# Patient Record
Sex: Female | Born: 1960 | Race: Black or African American | Hispanic: No | Marital: Single | Smoking: Never smoker
Health system: Southern US, Community
[De-identification: ages and names within clinical notes are randomized; demographics above are authoritative.]

## PROBLEM LIST (undated history)

## (undated) DIAGNOSIS — I4891 Unspecified atrial fibrillation: Secondary | ICD-10-CM

## (undated) DIAGNOSIS — D869 Sarcoidosis, unspecified: Secondary | ICD-10-CM

## (undated) DIAGNOSIS — I509 Heart failure, unspecified: Secondary | ICD-10-CM

## (undated) DIAGNOSIS — M1990 Unspecified osteoarthritis, unspecified site: Secondary | ICD-10-CM

## (undated) DIAGNOSIS — R12 Heartburn: Secondary | ICD-10-CM

## (undated) DIAGNOSIS — N200 Calculus of kidney: Secondary | ICD-10-CM

## (undated) DIAGNOSIS — E119 Type 2 diabetes mellitus without complications: Secondary | ICD-10-CM

## (undated) HISTORY — PX: ABDOMINAL HYSTERECTOMY: SHX81

## (undated) HISTORY — DX: Unspecified osteoarthritis, unspecified site: M19.90

## (undated) HISTORY — PX: KNEE SURGERY: SHX244

## (undated) HISTORY — DX: Heartburn: R12

## (undated) HISTORY — DX: Calculus of kidney: N20.0

## (undated) HISTORY — PX: PACEMAKER INSERTION: SHX728

---

## 2004-04-11 ENCOUNTER — Ambulatory Visit: Payer: Self-pay | Admitting: Specialist

## 2004-06-15 ENCOUNTER — Ambulatory Visit: Payer: Self-pay | Admitting: Rheumatology

## 2005-10-08 ENCOUNTER — Emergency Department: Payer: Self-pay | Admitting: Emergency Medicine

## 2005-11-01 ENCOUNTER — Ambulatory Visit: Payer: Self-pay | Admitting: Family Medicine

## 2007-06-11 HISTORY — PX: HAND SURGERY: SHX662

## 2007-10-21 ENCOUNTER — Ambulatory Visit: Payer: Self-pay | Admitting: Ophthalmology

## 2008-06-14 ENCOUNTER — Encounter: Payer: Self-pay | Admitting: Orthopedic Surgery

## 2008-10-04 ENCOUNTER — Ambulatory Visit: Payer: Self-pay | Admitting: Orthopedic Surgery

## 2009-11-17 ENCOUNTER — Ambulatory Visit: Payer: Self-pay | Admitting: Orthopedic Surgery

## 2010-03-13 ENCOUNTER — Ambulatory Visit: Payer: Self-pay | Admitting: Orthopedic Surgery

## 2010-06-10 HISTORY — PX: SHOULDER SURGERY: SHX246

## 2011-01-21 ENCOUNTER — Ambulatory Visit: Payer: Self-pay

## 2011-02-15 ENCOUNTER — Ambulatory Visit: Payer: Self-pay | Admitting: Internal Medicine

## 2011-10-14 ENCOUNTER — Encounter: Payer: Self-pay | Admitting: Orthopedic Surgery

## 2011-11-09 ENCOUNTER — Encounter: Payer: Self-pay | Admitting: Orthopedic Surgery

## 2012-01-31 ENCOUNTER — Ambulatory Visit: Payer: Self-pay | Admitting: Family Medicine

## 2012-04-21 ENCOUNTER — Ambulatory Visit: Payer: Self-pay | Admitting: Physician Assistant

## 2012-05-06 ENCOUNTER — Ambulatory Visit: Payer: Self-pay | Admitting: Family Medicine

## 2012-10-19 ENCOUNTER — Ambulatory Visit: Payer: Self-pay

## 2014-11-17 ENCOUNTER — Other Ambulatory Visit: Payer: Self-pay | Admitting: Family Medicine

## 2014-11-17 ENCOUNTER — Ambulatory Visit
Admission: RE | Admit: 2014-11-17 | Discharge: 2014-11-17 | Disposition: A | Discharge status: Home / Self Care | Payer: Federal, State, Local not specified - PPO | Source: Ambulatory Visit | Attending: Family Medicine | Admitting: Family Medicine

## 2014-11-17 DIAGNOSIS — R109 Unspecified abdominal pain: Secondary | ICD-10-CM | POA: Diagnosis present

## 2014-11-17 DIAGNOSIS — R319 Hematuria, unspecified: Secondary | ICD-10-CM | POA: Diagnosis present

## 2016-09-13 ENCOUNTER — Ambulatory Visit
Admission: EM | Admit: 2016-09-13 | Discharge: 2016-09-13 | Disposition: A | Discharge status: Home / Self Care | Payer: Federal, State, Local not specified - PPO | Attending: Family Medicine | Admitting: Family Medicine

## 2016-09-13 ENCOUNTER — Encounter: Payer: Self-pay | Admitting: Emergency Medicine

## 2016-09-13 DIAGNOSIS — S39012A Strain of muscle, fascia and tendon of lower back, initial encounter: Secondary | ICD-10-CM | POA: Diagnosis not present

## 2016-09-13 LAB — URINALYSIS, COMPLETE (UACMP) WITH MICROSCOPIC
BACTERIA UA: NONE SEEN
Bilirubin Urine: NEGATIVE
Glucose, UA: NEGATIVE mg/dL
KETONES UR: NEGATIVE mg/dL
LEUKOCYTES UA: NEGATIVE
Nitrite: NEGATIVE
PH: 6 (ref 5.0–8.0)
PROTEIN: NEGATIVE mg/dL
Specific Gravity, Urine: <1.005 — ABNORMAL LOW (ref 1.005–1.030)
WBC, UA: NONE SEEN WBC/hpf (ref 0–5)

## 2016-09-13 MED ORDER — NAPROXEN 500 MG PO TABS: 500.0000 mg | ORAL_TABLET | Freq: Two times a day (BID) | ORAL | 0 refills | Status: DC

## 2016-09-13 MED ORDER — METAXALONE 800 MG PO TABS: 800.0000 mg | ORAL_TABLET | Freq: Three times a day (TID) | ORAL | 0 refills | Status: DC

## 2016-09-13 NOTE — ED Provider Notes (Signed)
CSN: [id number]     Arrival date & time 09/13/16  0807 History   None    Chief Complaint  Patient presents with  . Back Pain   (Consider location/radiation/quality/duration/timing/severity/associated sxs/prior Treatment) HPI  56 year old female who presents with left-sided lower back pain for one week . She does not remember any specific injury. She started noticing her urine was "hot" and she started drinking more water. The pain started to resolve only to return last night more severe than before. She denies any vaginal discharge. 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 In review her records she had a similar episode of flank pain in 2016 that a CAT scan was normal.      History reviewed. No pertinent past medical history. Past Surgical History:  Procedure Laterality Date  . ABDOMINAL HYSTERECTOMY    . KNEE SURGERY Right    History reviewed. No pertinent family history. Social History  Substance Use Topics  . Smoking status: Never Smoker  . Smokeless tobacco: Never Used  . Alcohol use Yes   OB History    No data available     Review of Systems  Constitutional: Positive for activity change. Negative for chills, fatigue and fever.  Musculoskeletal: Positive for back pain and myalgias.  All other systems reviewed and are negative.   Allergies  Patient has no known allergies.  Home Medications   Prior to Admission medications   Medication Sig Start Date End Date Taking? Authorizing Provider  metaxalone (SKELAXIN) 800 MG tablet Take 1 tablet (800 mg total) by mouth 3 (three) times daily. 09/13/16   Lutricia Feil, PA-C  naproxen (NAPROSYN) 500 MG tablet Take 1 tablet (500 mg total) by mouth 2 (two) times daily with a meal. 09/13/16   Lutricia Feil, PA-C   Meds Ordered and Administered this Visit  Medications - No data to display  BP (!) 157/90 (BP Location: Left Arm)   Pulse 80    Temp 98.6 F (37 C) (Oral)   Resp 16   Ht  (1.753 m)   Wt 200 lb (90.7 kg)   SpO2 100%   BMI 29.53 kg/m  No data found.   Physical Exam  Constitutional: She is oriented to person, place, and time. She appears well-developed and well-nourished. No distress.  HENT:  Head: Normocephalic and atraumatic.  Eyes: Pupils are equal, round, and reactive to light.  Neck: Normal range of motion.  Musculoskeletal: Normal range of motion. She exhibits tenderness. She exhibits no edema.  Examination of the spine was performed Herbert Seta, RN as chaperone and assisted. Patient pelvis is level in stance. She has good range of motion with discomfort at the extremes of flexion and lateral flexion bilaterally. Is it tender to palpation in the right paraspinous muscles at the L4-S1 levels. This reproduces her symptoms. Is able to toe and heel walk. Straight leg raise testing is negative at 90 in the sitting position bilaterally. Distal sensation is intact to light touch throughout. EHL peroneal and anterior tibialis muscles are strong to clinical testing. DTRs are 2+ over 4 bilaterally symmetrical.  Neurological: She is alert and oriented to person, place, and time.  Skin: Skin is warm and dry. She is not diaphoretic.  Psychiatric: She has a normal mood and affect. Her behavior is normal. Judgment and thought content normal.  Nursing note and vitals reviewed.   Urgent Care Course  Procedures (including critical care time)  Labs Review Labs Reviewed  URINALYSIS, COMPLETE (UACMP) WITH MICROSCOPIC - Abnormal; Notable for the following:       Result Value   Specific Gravity, Urine <1.005 (*)    Hgb urine dipstick SMALL (*)    Squamous Epithelial / LPF 0-5 (*)    All other components within normal limits    Imaging Review No results found.   Visual Acuity Review  Right Eye Distance:   Left Eye Distance:   Bilateral Distance:    Right Eye Near:   Left Eye Near:    Bilateral Near:          MDM   1. Strain of lumbar region, initial encounter    Discharge Medication List as of 09/13/2016  9:07 AM    START taking these medications   Details  metaxalone (SKELAXIN) 800 MG tablet Take 1 tablet (800 mg total) by mouth 3 (three) times daily., Starting Fri 09/13/2016, Normal    naproxen (NAPROSYN) 500 MG tablet Take 1 tablet (500 mg total) by mouth 2 (two) times daily with a meal., Starting Fri 09/13/2016, Normal      Plan: 1. Test/x-ray results and diagnosis reviewed with patient 2. rx as per orders; risks, benefits, potential side effects reviewed with patient 3. Recommend supportive treatment with Rest and symptom avoidance. She should limit her sitting lifting and bending. I recommend she follow-up with her primary care physician Dr. Gavin Potters next week if she is not improving. Patient preferred not to have a shot of Toradol and would like to only used oral medications. 4. F/u prn if symptoms worsen or don't improve     Lutricia Feil, PA-C 09/13/16 1203

## 2016-09-13 NOTE — ED Triage Notes (Signed)
Patient c/o left sided lower back that started a week ago.  Patient denies injury or fall.

## 2016-12-17 ENCOUNTER — Ambulatory Visit
Admission: EM | Admit: 2016-12-17 | Discharge: 2016-12-17 | Disposition: A | Discharge status: Home / Self Care | Payer: Federal, State, Local not specified - PPO | Attending: Family Medicine | Admitting: Family Medicine

## 2016-12-17 ENCOUNTER — Encounter: Payer: Self-pay | Admitting: Emergency Medicine

## 2016-12-17 DIAGNOSIS — K529 Noninfective gastroenteritis and colitis, unspecified: Secondary | ICD-10-CM

## 2016-12-17 DIAGNOSIS — R197 Diarrhea, unspecified: Secondary | ICD-10-CM

## 2016-12-17 DIAGNOSIS — R112 Nausea with vomiting, unspecified: Secondary | ICD-10-CM

## 2016-12-17 LAB — CBC WITH DIFFERENTIAL/PLATELET
BASOS PCT: 1 %
Basophils Absolute: 0.1 10*3/uL (ref 0–0.1)
EOS ABS: 0 10*3/uL (ref 0–0.7)
Eosinophils Relative: 1 %
HCT: 42.3 % (ref 35.0–47.0)
HEMOGLOBIN: 14 g/dL (ref 12.0–16.0)
Lymphocytes Relative: 13 %
Lymphs Abs: 0.7 10*3/uL — ABNORMAL LOW (ref 1.0–3.6)
MCH: 27.2 pg (ref 26.0–34.0)
MCHC: 33.2 g/dL (ref 32.0–36.0)
MCV: 81.8 fL (ref 80.0–100.0)
Monocytes Absolute: 0.3 10*3/uL (ref 0.2–0.9)
Monocytes Relative: 6 %
NEUTROS PCT: 79 %
Neutro Abs: 4.1 10*3/uL (ref 1.4–6.5)
PLATELETS: 306 10*3/uL (ref 150–440)
RBC: 5.17 MIL/uL (ref 3.80–5.20)
RDW: 13.7 % (ref 11.5–14.5)
WBC: 5.1 10*3/uL (ref 3.6–11.0)

## 2016-12-17 LAB — COMPREHENSIVE METABOLIC PANEL
ALBUMIN: 4.5 g/dL (ref 3.5–5.0)
ALK PHOS: 92 U/L (ref 38–126)
ALT: 20 U/L (ref 14–54)
ANION GAP: 8 (ref 5–15)
AST: 25 U/L (ref 15–41)
BILIRUBIN TOTAL: 0.7 mg/dL (ref 0.3–1.2)
BUN: 6 mg/dL (ref 6–20)
CALCIUM: 9.2 mg/dL (ref 8.9–10.3)
CO2: 25 mmol/L (ref 22–32)
Chloride: 105 mmol/L (ref 101–111)
Creatinine, Ser: 0.92 mg/dL (ref 0.44–1.00)
GFR calc Af Amer: >60 mL/min (ref >60)
GLUCOSE: 99 mg/dL (ref 65–99)
Potassium: 3.7 mmol/L (ref 3.5–5.1)
Sodium: 138 mmol/L (ref 135–145)
TOTAL PROTEIN: 7.9 g/dL (ref 6.5–8.1)

## 2016-12-17 LAB — LIPASE, BLOOD: LIPASE: 21 U/L (ref 11–51)

## 2016-12-17 LAB — AMYLASE: Amylase: 68 U/L (ref 28–100)

## 2016-12-17 MED ORDER — BISMUTH SUBSALICYLATE 262 MG/15ML PO SUSP: 30.0000 mL | Freq: Three times a day (TID) | ORAL | 0 refills | Status: DC

## 2016-12-17 MED ORDER — ONDANSETRON 8 MG PO TBDP: 8.0000 mg | ORAL_TABLET | Freq: Three times a day (TID) | ORAL | 0 refills | Status: DC | PRN

## 2016-12-17 MED ORDER — ONDANSETRON 8 MG PO TBDP
8.0000 mg | ORAL_TABLET | Freq: Once | ORAL | Status: AC
  Administered 2016-12-17: 8 mg via ORAL

## 2016-12-17 MED ORDER — RANITIDINE HCL 150 MG PO CAPS: ORAL_CAPSULE | ORAL | 0 refills | Status: DC

## 2016-12-17 NOTE — ED Triage Notes (Signed)
Patient c/o N/V/D that started Friday.  Patient reports some improvement on Sunday but states that on Monday morning started having vomiting and stomach pain.

## 2016-12-17 NOTE — ED Provider Notes (Signed)
[facility]    CSN: [id number] Arrival date & time: 12/17/16  0803     History   Chief Complaint Chief Complaint  Patient presents with  . Emesis  . Diarrhea    HPI Whitney Ruiz is a 56 y.o. female.   This 56 year old African American female who states she ate something on her way home from work Friday morning she states she doesn't remember [facility] [employer] food by Friday night she was throwing up having diarrhea unable to go to work on Friday she was also having a Sunday start ago work Monday evening night states she got during SWE[employer] nausea or vomiting. A lot of nausea vomiting she's had abdominal pain and diarrhea. She felt weak all over and just felt unable to function. The diarrhea that was present Friday Saturday and Sunday his essentially almost gone she states she's not had much in system she is unable to give a stool specimen at this time. She is having some diffuse abdominal pain as well She is abdominal hysterectomy knee surgery and no other surgeries. No known drug allergies. She does not smoke   The history is provided by the patient. No language interpreter was used.  Emesis  Severity:  Moderate Duration:  5 days Timing:  Intermittent Quality:  Stomach contents Able to tolerate:  Solids Progression:  Worsening Chronicity:  New Recent urination:  Normal Context: not post-tussive   Relieved by:  Nothing Worsened by:  Nothing Ineffective treatments:  None tried Associated symptoms: abdominal pain and diarrhea   Risk factors: suspect food intake   Risk factors: no alcohol use, no diabetes, not pregnant, no prior abdominal surgery, no sick contacts and no travel to endemic areas   Diarrhea  Associated symptoms: abdominal pain and vomiting     History reviewed. No pertinent past medical history.  There are no active problems to display for this patient.   Past Surgical History:  Procedure Laterality  Date  . ABDOMINAL HYSTERECTOMY    . KNEE SURGERY Right     OB History    No data available       Home Medications    Prior to Admission medications   Medication Sig Start Date End Date Taking? Authorizing Provider  bismuth subsalicylate (PEPTO-BISMOL) 262 MG/15ML suspension Take 30 mLs by mouth 3 (three) times daily. With zantac for 3-5 days may substitute tablets 12/17/16   Hassan RowanWade, Bobbe Quilter, MD  ondansetron (ZOFRAN ODT) 8 MG disintegrating tablet Take 1 tablet (8 mg total) by mouth every 8 (eight) hours as needed for nausea or vomiting. 12/17/16   Hassan RowanWade, Daltin Crist, MD  ranitidine (ZANTAC) 150 MG capsule 1 capsule 3x a day for 3-5 days with pepto 12/17/16   Hassan RowanWade, Trasean Delima, MD    Family History History reviewed. No pertinent family history.  Social History Social History  Substance Use Topics  . Smoking status: Never Smoker  . Smokeless tobacco: Never Used  . Alcohol use Yes     Allergies   Patient has no known allergies.   Review of Systems Review of Systems  Gastrointestinal: Positive for abdominal pain, diarrhea and vomiting.  All other systems reviewed and are negative.    Physical Exam Triage Vital Signs ED Triage Vitals [12/17/16 0818]  Enc Vitals Group     BP 128/81     Pulse Rate 83     Resp 16     Temp 98 F (36.7 C)  Temp Source Oral     SpO2 100 %     Weight 220 lb (99.8 kg)     Height 5\' 9"  (1.753 m)     Head Circumference      Peak Flow      Pain Score 7     Pain Loc      Pain Edu?      Excl. in GC?    No data found.   Updated Vital Signs BP 128/81 (BP Location: Left Arm)   Pulse 83   Temp 98 F (36.7 C) (Oral)   Resp 16   Ht 5\' 9"  (1.753 m)   Wt 220 lb (99.8 kg)   SpO2 100%   BMI 32.49 kg/m   Visual Acuity Right Eye Distance:   Left Eye Distance:   Bilateral Distance:    Right Eye Near:   Left Eye Near:    Bilateral Near:     Physical Exam  Constitutional: She is oriented to person, place, and time. She appears well-developed  and well-nourished.  HENT:  Head: Normocephalic.  Right Ear: External ear normal.  Left Ear: External ear normal.  Mouth/Throat: Oropharynx is clear and moist.  Eyes: Pupils are equal, round, and reactive to light.  Neck: Normal range of motion. Neck supple.  Cardiovascular: Normal rate, regular rhythm and normal heart sounds.   Pulmonary/Chest: Effort normal and breath sounds normal. No respiratory distress.  Abdominal: Soft. She exhibits distension.  Musculoskeletal: Normal range of motion. She exhibits no edema or deformity.  Neurological: She is alert and oriented to person, place, and time. No cranial nerve deficit.  Skin: Skin is warm.  Psychiatric: She has a normal mood and affect.  Vitals reviewed.    UC Treatments / Results  Labs (all labs ordered are listed, but only abnormal results are displayed) Labs Reviewed  CBC WITH DIFFERENTIAL/PLATELET - Abnormal; Notable for the following:       Result Value   Lymphs Abs 0.7 (*)    All other components within normal limits  AMYLASE  COMPREHENSIVE METABOLIC PANEL  LIPASE, BLOOD    EKG  EKG Interpretation None       Radiology No results found.  Procedures Procedures (including critical care time)  Medications Ordered in UC Medications  ondansetron (ZOFRAN-ODT) disintegrating tablet 8 mg (8 mg Oral Given 12/17/16 0839)   Results for orders placed or performed during the hospital encounter of 12/17/16  CBC with Differential  Result Value Ref Range   WBC 5.1 3.6 - 11.0 K/uL   RBC 5.17 3.80 - 5.20 MIL/uL   Hemoglobin 14.0 12.0 - 16.0 g/dL   HCT 16.1 09.6 - 04.5 %   MCV 81.8 80.0 - 100.0 fL   MCH 27.2 26.0 - 34.0 pg   MCHC 33.2 32.0 - 36.0 g/dL   RDW 40.9 81.1 - 91.4 %   Platelets 306 150 - 440 K/uL   Neutrophils Relative % 79 %   Neutro Abs 4.1 1.4 - 6.5 K/uL   Lymphocytes Relative 13 %   Lymphs Abs 0.7 (L) 1.0 - 3.6 K/uL   Monocytes Relative 6 %   Monocytes Absolute 0.3 0.2 - 0.9 K/uL   Eosinophils  Relative 1 %   Eosinophils Absolute 0.0 0 - 0.7 K/uL   Basophils Relative 1 %   Basophils Absolute 0.1 0 - 0.1 K/uL  Amylase  Result Value Ref Range   Amylase 68 28 - 100 U/L  Comprehensive metabolic panel  Result Value Ref Range  Sodium 138 135 - 145 mmol/L   Potassium 3.7 3.5 - 5.1 mmol/L   Chloride 105 101 - 111 mmol/L   CO2 25 22 - 32 mmol/L   Glucose, Bld 99 65 - 99 mg/dL   BUN 6 6 - 20 mg/dL   Creatinine, Ser 8.11 0.44 - 1.00 mg/dL   Calcium 9.2 8.9 - 91.4 mg/dL   Total Protein 7.9 6.5 - 8.1 g/dL   Albumin 4.5 3.5 - 5.0 g/dL   AST 25 15 - 41 U/L   ALT 20 14 - 54 U/L   Alkaline Phosphatase 92 38 - 126 U/L   Total Bilirubin 0.7 0.3 - 1.2 mg/dL   GFR calc non Af Amer >60 >60 mL/min   GFR calc Af Amer >60 >60 mL/min   Anion gap 8 5 - 15  Lipase, blood  Result Value Ref Range   Lipase 21 11 - 51 U/L    Initial Impression / Assessment and Plan / UC Course  I have reviewed the triage vital signs and the nursing notes.  Pertinent labs & imaging results that were available during my care of the patient were reviewed by me and considered in my medical decision making (see chart for details).     patient has requested  Antibiotic multiple times I have also  Explained that since she is unable to give [place] a stool specimen with unable to evaluate what antibiotic is most appropriate to prescribe. We'll give her a note for work for today and tomorrow. In the meantime I'll give her a dose of Zofran here in the office and a prescription for Zofran outpatient and will start her on Zantac and Pepto-Bismol 3 times a day for the next 3-5 days. Also will give her a prescription for stool specimen study when she can  Final Clinical Impressions(s) / UC Diagnoses   Final diagnoses:  Nausea vomiting and diarrhea  Gastroenteritis    New Prescriptions New Prescriptions   BISMUTH SUBSALICYLATE (PEPTO-BISMOL) 262 MG/15ML SUSPENSION    Take 30 mLs by mouth 3 (three) times daily. With zantac for  3-5 days may substitute tablets   ONDANSETRON (ZOFRAN ODT) 8 MG DISINTEGRATING TABLET    Take 1 tablet (8 mg total) by mouth every 8 (eight) hours as needed for nausea or vomiting.   RANITIDINE (ZANTAC) 150 MG CAPSULE    1 capsule 3x a day for 3-5 days with pepto     Note: This dictation was prepared with Dragon dictation along with smaller phrase technology. Any transcriptional errors that result from this process are unintentional.   Hassan Rowan, MD 12/17/16 [phone]

## 2017-01-15 ENCOUNTER — Encounter: Payer: Self-pay | Admitting: *Deleted

## 2017-01-15 ENCOUNTER — Emergency Department: Payer: Federal, State, Local not specified - PPO

## 2017-01-15 ENCOUNTER — Emergency Department
Admission: EM | Admit: 2017-01-15 | Discharge: 2017-01-16 | Disposition: A | Discharge status: Home / Self Care | Payer: Federal, State, Local not specified - PPO | Attending: Emergency Medicine | Admitting: Emergency Medicine

## 2017-01-15 DIAGNOSIS — Z79899 Other long term (current) drug therapy: Secondary | ICD-10-CM | POA: Insufficient documentation

## 2017-01-15 DIAGNOSIS — R109 Unspecified abdominal pain: Secondary | ICD-10-CM

## 2017-01-15 HISTORY — DX: Sarcoidosis, unspecified: D86.9

## 2017-01-15 LAB — URINALYSIS, COMPLETE (UACMP) WITH MICROSCOPIC
BILIRUBIN URINE: NEGATIVE
Glucose, UA: NEGATIVE mg/dL
KETONES UR: 20 mg/dL — AB
LEUKOCYTES UA: NEGATIVE
NITRITE: NEGATIVE
Protein, ur: NEGATIVE mg/dL
SPECIFIC GRAVITY, URINE: 1.002 — AB (ref 1.005–1.030)
pH: 6 (ref 5.0–8.0)

## 2017-01-15 LAB — CBC WITH DIFFERENTIAL/PLATELET
BASOS ABS: 0 10*3/uL (ref 0–0.1)
BASOS PCT: 0 %
EOS ABS: 0 10*3/uL (ref 0–0.7)
EOS PCT: 0 %
HCT: 41.9 % (ref 35.0–47.0)
HEMOGLOBIN: 14 g/dL (ref 12.0–16.0)
Lymphocytes Relative: 13 %
Lymphs Abs: 0.9 10*3/uL — ABNORMAL LOW (ref 1.0–3.6)
MCH: 26.8 pg (ref 26.0–34.0)
MCHC: 33.3 g/dL (ref 32.0–36.0)
MCV: 80.6 fL (ref 80.0–100.0)
Monocytes Absolute: 0.4 10*3/uL (ref 0.2–0.9)
Monocytes Relative: 6 %
NEUTROS PCT: 81 %
Neutro Abs: 5.7 10*3/uL (ref 1.4–6.5)
Platelets: 289 10*3/uL (ref 150–440)
RBC: 5.21 MIL/uL — AB (ref 3.80–5.20)
RDW: 13.1 % (ref 11.5–14.5)
WBC: 7.1 10*3/uL (ref 3.6–11.0)

## 2017-01-15 LAB — COMPREHENSIVE METABOLIC PANEL
ALT: 23 U/L (ref 14–54)
ANION GAP: 13 (ref 5–15)
AST: 31 U/L (ref 15–41)
Albumin: 4.5 g/dL (ref 3.5–5.0)
Alkaline Phosphatase: 89 U/L (ref 38–126)
BILIRUBIN TOTAL: 0.8 mg/dL (ref 0.3–1.2)
BUN: 9 mg/dL (ref 6–20)
CO2: 22 mmol/L (ref 22–32)
Calcium: 9.4 mg/dL (ref 8.9–10.3)
Chloride: 105 mmol/L (ref 101–111)
Creatinine, Ser: 0.81 mg/dL (ref 0.44–1.00)
Glucose, Bld: 142 mg/dL — ABNORMAL HIGH (ref 65–99)
POTASSIUM: 3.4 mmol/L — AB (ref 3.5–5.1)
Sodium: 140 mmol/L (ref 135–145)
TOTAL PROTEIN: 7.7 g/dL (ref 6.5–8.1)

## 2017-01-15 LAB — LIPASE, BLOOD: LIPASE: 23 U/L (ref 11–51)

## 2017-01-15 MED ORDER — MORPHINE SULFATE (PF) 4 MG/ML IV SOLN
4.0000 mg | Freq: Once | INTRAVENOUS | Status: AC
  Administered 2017-01-15: 4 mg via INTRAVENOUS
  Filled 2017-01-15: qty 1

## 2017-01-15 MED ORDER — SODIUM CHLORIDE 0.9 % IV BOLUS (SEPSIS)
1000.0000 mL | Freq: Once | INTRAVENOUS | Status: AC
  Administered 2017-01-15: 1000 mL via INTRAVENOUS

## 2017-01-15 MED ORDER — ONDANSETRON HCL 4 MG/2ML IJ SOLN
4.0000 mg | Freq: Once | INTRAMUSCULAR | Status: AC
  Administered 2017-01-15: 4 mg via INTRAVENOUS
  Filled 2017-01-15: qty 2

## 2017-01-15 MED ORDER — DICYCLOMINE HCL 10 MG PO CAPS
10.0000 mg | ORAL_CAPSULE | Freq: Once | ORAL | Status: AC
  Administered 2017-01-15: 10 mg via ORAL
  Filled 2017-01-15: qty 1

## 2017-01-15 NOTE — ED Triage Notes (Signed)
Per EMS report, patient c/o nausea and right lower abdominal pain for 3 days. [facility]

## 2017-01-15 NOTE — ED Notes (Signed)
Patient is unable to void at this time 

## 2017-01-15 NOTE — ED Provider Notes (Signed)
[facility] Emergency Department Provider Note  ____________________________________________   I have reviewed the triage vital signs and the nursing notes.   HISTORY  Chief Complaint Abdominal Pain   History limited by: Not Limited   HPI Whitney Ruiz is a 56 y.o. female who presents to the emergency department today because of abdominal pain. It is located in the right lower quadrant. It started two days ago. It started more in the patients right flank and has migrated down closer to the pelvis. She describes it as sharp. It has been constant. It has been associated with nausea and vomiting. It is severe. Denies similar pain in the past. Denies change in bowel movements or urination. Denies fevers.   No past medical history on file.  There are no active problems to display for this patient.   Past Surgical History:  Procedure Laterality Date  . ABDOMINAL HYSTERECTOMY    . KNEE SURGERY Right     Prior to Admission medications   Medication Sig Start Date End Date Taking? Authorizing Provider  bismuth subsalicylate (PEPTO-BISMOL) 262 MG/15ML suspension Take 30 mLs by mouth 3 (three) times daily. With zantac for 3-5 days may substitute tablets 12/17/16   Hassan RowanWade, Eugene, MD  ondansetron (ZOFRAN ODT) 8 MG disintegrating tablet Take 1 tablet (8 mg total) by mouth every 8 (eight) hours as needed for nausea or vomiting. 12/17/16   Hassan RowanWade, Eugene, MD  ranitidine (ZANTAC) 150 MG capsule 1 capsule 3x a day for 3-5 days with pepto 12/17/16   Hassan RowanWade, Eugene, MD    Allergies Patient has no known allergies.  No family history on file.  Social History Social History  Substance Use Topics  . Smoking status: Never Smoker  . Smokeless tobacco: Never Used  . Alcohol use Yes    Review of Systems Constitutional: No fever/chills Eyes: No visual changes. ENT: No sore throat. Cardiovascular: Denies chest pain. Respiratory: Denies shortness of breath. Gastrointestinal:  Positive for abdominal pain. Genitourinary: Negative for dysuria. Musculoskeletal: Negative for back pain. Skin: Negative for rash. Neurological: Negative for headaches, focal weakness or numbness.  ____________________________________________   PHYSICAL EXAM:  VITAL SIGNS: ED Triage Vitals  Enc Vitals Group     BP 161/103     Pulse 86     Resp 18     Temp 98.2     Temp src      SpO2 100   Constitutional: Alert and oriented. Well appearing and in no distress. Eyes: Conjunctivae are normal.  ENT   Head: Normocephalic and atraumatic.   Nose: No congestion/rhinnorhea.   Mouth/Throat: Mucous membranes are moist.   Neck: No stridor. Hematological/Lymphatic/Immunilogical: No cervical lymphadenopathy. Cardiovascular: Normal rate, regular rhythm.  No murmurs, rubs, or gallops.  Respiratory: Normal respiratory effort without tachypnea nor retractions. Breath sounds are clear and equal bilaterally. No wheezes/rales/rhonchi. Gastrointestinal: Soft and somewhat diffusely tender to palpation slightly worse on the right. No rebound. No guarding.  Genitourinary: Deferred Musculoskeletal: Normal range of motion in all extremities. No lower extremity edema. Neurologic:  Normal speech and language. No gross focal neurologic deficits are appreciated.  Skin:  Skin is warm, dry and intact. No rash noted. Psychiatric: Mood and affect are normal. Speech and behavior are normal. Patient exhibits appropriate insight and judgment.  ____________________________________________    LABS (pertinent positives/negatives)  Labs Reviewed  URINALYSIS, COMPLETE (UACMP) WITH MICROSCOPIC - Abnormal; Notable for the following:       Result Value   Color, Urine STRAW (*)  APPearance CLEAR (*)    Specific Gravity, Urine 1.002 (*)    Hgb urine dipstick MODERATE (*)    Ketones, ur 20 (*)    Bacteria, UA RARE (*)    Squamous Epithelial / LPF 0-5 (*)    All other components within normal limits   CBC WITH DIFFERENTIAL/PLATELET - Abnormal; Notable for the following:    RBC 5.21 (*)    Lymphs Abs 0.9 (*)    All other components within normal limits  COMPREHENSIVE METABOLIC PANEL - Abnormal; Notable for the following:    Potassium 3.4 (*)    Glucose, Bld 142 (*)    All other components within normal limits  LIPASE, BLOOD     ____________________________________________   EKG  None  ____________________________________________    RADIOLOGY  CT renal IMPRESSION:  1. No evidence of obstructive uropathy. No acute intra-abdominal  process.    ____________________________________________   PROCEDURES  Procedures  ____________________________________________   INITIAL IMPRESSION / ASSESSMENT AND PLAN / ED COURSE  Pertinent labs & imaging results that were available during my care of the patient were reviewed by me and considered in my medical decision making (see chart for details).  Patient presented to the emergency department today because of concerns for abdominal pain. Started the right flank going to the right groin. Urine did have some blood in it. CT renal stone however did not show any stone. Patient did feel slight improvement after medications given here. We will plan on discharging home to follow up with primary care.  ____________________________________________   FINAL CLINICAL IMPRESSION(S) / ED DIAGNOSES  Abdominal pain  Note: This dictation was prepared with Dragon dictation. Any transcriptional errors that result from this process are unintentional     Phineas Semen, MD 01/16/17 [phone]

## 2017-01-30 NOTE — Progress Notes (Signed)
01/31/2017 8:34 AM   Whitney Ruiz 07/29/1960 [id number]  Referring provider: Rolm Gala, MD [address] [place], [place] [address]  Chief Complaint  Patient presents with  . New Patient (Initial Visit)    Kidney stone referred by Dr. [facility]    HPI: Patient is a 56 year old African American female who presents/is referred by Dr. Gavin Potters for nephrolithiasis.  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  Then two weeks later the pain started in her lower back and side.  She states that the pain started coming on prior to the fall.    Patient states the onset of the pain was four weeks ago.   It was sharp.  It lasted for several hours.  The pain was located right flank and radiated to right groin.  The pain was a 10/10.  Nothing made the pain better.   Nothing made the pain worse. She did not have gross hematuria, fevers, chills, nausea or vomiting.  In [facility] ED, her UA was positive for 0-5 RBC's.  Serum creatinine 0.81.  CT Renal stone study was negative.     She then sought treatment at [facility] ED.  Contrast CT noted a punctate right UVJ stone.    Today, she is having lower right waist pain that radiated into the right lower quadrant.  She is having urinary frequency, urgency, dysuria and nocturia.  She denies gross hematuria.  She is having nausea and vomiting.  She denies fevers and chills.  Patient did not stop at the lab for her las, but her UA 3 weeks ago was unremarkable.    She does not have a prior history of stones.       Reviewed referral notes - see above  PMH: Past Medical History:  Diagnosis Date  . Arthritis   . Heartburn   . Kidney stone   . Sarcoidosis     Surgical History: Past Surgical History:  Procedure Laterality Date  . ABDOMINAL HYSTERECTOMY    . HAND SURGERY  2009  . KNEE SURGERY Right   . SHOULDER SURGERY  2012    Home Medications:    Allergies as of 01/31/2017      Reactions   Flagyl [metronidazole] Nausea And Vomiting      Medication List       Accurate as of 01/31/17 11:59 PM. Always use your most recent med list.          ACETAMINOPHEN PO Take by mouth.   bismuth subsalicylate 262 MG/15ML suspension Commonly known as:  PEPTO-BISMOL Take 30 mLs by mouth 3 (three) times daily. With zantac for 3-5 days may substitute tablets   dicyclomine 20 MG tablet Commonly known as:  BENTYL TAKE ONE TABLET BY MOUTH EVERY 8 HOURS AS NEEDED FOR ABDOMINAL PAIN   HYDROcodone-acetaminophen 5-325 MG tablet Commonly known as:  NORCO/VICODIN Take by mouth.   ketorolac 10 MG tablet Commonly known as:  TORADOL TAKE ONE TABLET BY MOUTH EVERY 6 HOURS AS NEEDED FOR PAIN   ondansetron 8 MG disintegrating tablet Commonly known as:  ZOFRAN ODT Take 1 tablet (8 mg total) by mouth every 8 (eight) hours as needed for nausea or vomiting.   ranitidine 150 MG capsule Commonly known as:  ZANTAC 1 capsule 3x a day for 3-5 days with pepto   tamsulosin 0.4 MG Caps capsule Commonly known as:  FLOMAX Take 0.4 mg by mouth.       Allergies:  Allergies  Allergen Reactions  . Flagyl [Metronidazole] Nausea And Vomiting    Family History: Family History  Problem Relation Age of Onset  . Kidney cancer Neg Hx   . Bladder Cancer Neg Hx     Social History:  reports that she has never smoked. She has never used smokeless tobacco. She reports that she drinks alcohol. She reports that she does not use drugs.  ROS: UROLOGY Frequent Urination?: Yes Hard to postpone urination?: Yes Burning/pain with urination?: Yes Get up at night to urinate?: Yes Leakage of urine?: No Urine stream starts and stops?: No Trouble starting stream?: No Do you have to strain to urinate?: No Blood in urine?: No Urinary tract infection?: No Sexually transmitted disease?: No Injury to kidneys or bladder?: No Painful intercourse?: No Weak stream?:  No Currently pregnant?: No Vaginal bleeding?: No Last menstrual period?: n  Gastrointestinal Nausea?: Yes Vomiting?: Yes Indigestion/heartburn?: No Diarrhea?: No Constipation?: Yes  Constitutional Fever: No Night sweats?: Yes Weight loss?: No Fatigue?: No  Skin Skin rash/lesions?: No Itching?: No  Eyes Blurred vision?: No Double vision?: No  Ears/Nose/Throat Sore throat?: No Sinus problems?: No  Hematologic/Lymphatic Swollen glands?: No Easy bruising?: No  Cardiovascular Leg swelling?: No Chest pain?: No  Respiratory Cough?: No Shortness of breath?: No  Endocrine Excessive thirst?: No  Musculoskeletal Back pain?: Yes Joint pain?: No  Neurological Headaches?: No Dizziness?: No  Psychologic Depression?: No Anxiety?: No  Physical Exam: BP (!) 142/90   Pulse 76   Ht 5\' 9"  (1.753 m)   Wt 242 lb 8 oz (110 kg)   BMI 35.81 kg/m   Constitutional: Well nourished. Alert and oriented, No acute distress. HEENT: [place] AT, moist mucus membranes. Trachea midline, no masses. Cardiovascular: No clubbing, cyanosis, or edema. Respiratory: Normal respiratory effort, no increased work of breathing. GI: Abdomen is soft, non tender, non distended, no abdominal masses. Liver and spleen not palpable.  No hernias appreciated.  Stool sample for occult testing is not indicated.   GU: Right CVA tenderness.  No bladder fullness or masses.   Skin: No rashes, bruises or suspicious lesions. Lymph: No cervical or inguinal adenopathy. Neurologic: Grossly intact, no focal deficits, moving all 4 extremities.  Patient is walking with a limp.   Psychiatric: Normal mood and affect.  Laboratory Data: Lab Results  Component Value Date   WBC 7.1 01/15/2017   HGB 14.0 01/15/2017   HCT 41.9 01/15/2017   MCV 80.6 01/15/2017   PLT 289 01/15/2017    Lab Results  Component Value Date   CREATININE 0.81 01/15/2017    Lab Results  Component Value Date   AST 31 01/15/2017   Lab  Results  Component Value Date   ALT 23 01/15/2017    I have reviewed the labs.   Pertinent Imaging: CLINICAL DATA:  Right lower abdominal and flank pain for 3 days.  EXAM: CT ABDOMEN AND PELVIS WITHOUT CONTRAST  TECHNIQUE: Multidetector CT imaging of the abdomen and pelvis was performed following the standard protocol without IV contrast.  COMPARISON:  CT abdomen and pelvis dated November 17, 2014.  FINDINGS: Lower chest: No acute abnormality.  Right basilar atelectasis.  Hepatobiliary: No focal liver abnormality is seen. No gallstones, gallbladder wall thickening, or biliary dilatation.  Pancreas: Unremarkable. No pancreatic ductal dilatation or surrounding inflammatory changes.  Spleen: Normal in size without focal abnormality.  Adrenals/Urinary Tract: Adrenal glands are unremarkable. Kidneys are normal,  without renal calculi, focal lesion, or hydronephrosis. Bladder is unremarkable.  Stomach/Bowel: Stomach is within normal limits. Appendix appears normal. No evidence of bowel wall thickening, distention, or inflammatory changes. Scattered colonic diverticula.  Vascular/Lymphatic: No significant vascular findings are present. No enlarged abdominal or pelvic lymph nodes.  Reproductive: Status post hysterectomy. No adnexal masses.  Other: Small fat containing umbilical hernia.  No ascites.  Musculoskeletal: No acute or significant osseous findings.  IMPRESSION: 1. No evidence of obstructive uropathy. No acute intra-abdominal process.   Electronically Signed   By: Obie Dredge M.D.   On: 01/15/2017 13:42  Punctate calcification at the right UVJ may represent obstructing ureteral calculus without significant hydronephrosis.  Addendum (Dr.Altun dictating 01/23/2017 10:59 AM) Diffuse ill-defined hypodensities throughout the liver may represent areas of focal fatty infiltration or fibrosis or patchy enhancement.  Result Narrative  EXAM: CT abdomen  and pelvis with contrast DATE: 01/22/2017 10:17 PM ACCESSION: [id number] UN DICTATED: 01/22/2017 11:46 PM INTERPRETATION LOCATION: [facility]  CLINICAL INDICATION: 56 years old Female with ABDOMINAL PAIN, (specify site in comments)-right side/flank-  COMPARISON: None  TECHNIQUE: A spiral CT scan was obtained with IV contrast from the lung bases to the pubic symphysis.Images were reconstructed in the axial plane. Coronal and sagittal reformatted images were also provided for further evaluation.  FINDINGS: LOWER CHEST: Unremarkable.  HEPATOBILIARY: Diffuse ill-defined hypodensities throughout the liver may represent areas of focal fatty infiltration. No focal hepatic lesions identified. No biliary ductal dilatation.  GALLBLADDER: Decompressed gallbladder. PANCREAS: Unremarkable. SPLEEN: Unremarkable. ADRENAL GLANDS: Unremarkable. KIDNEYS/URETERS: Punctate calcification at the right UVJ may represent obstructing ureteral calculus. No significant hydronephrosis. No renal masses identified. VASCULATURE: Abdominal aorta within normal limits for patient's age. Portal vein, splenic vein, and SMV are patent. BOWEL/PERITONEUM:Appendix is unremarkable. No bowel obstruction. No ascites. LYMPH NODES: No adenopathy. BLADDER: Unremarkable. REPRODUCTIVE: Unremarkable. BONES:Multilevel vertebral degenerative changes. SOFT TISSUES: Unremarkable.  Other Result Information  Interface, Rad Results In - 01/23/2017 11:00 AM EDT EXAM: CT abdomen and pelvis with contrast DATE: 01/22/2017 10:17 PM ACCESSION: [id number] UN DICTATED: 01/22/2017 11:46 PM INTERPRETATION LOCATION: [facility]  CLINICAL INDICATION: 56 years old Female with ABDOMINAL PAIN, (specify site in comments)-right side/flank-    COMPARISON: None  TECHNIQUE: A spiral CT scan was obtained with IV contrast from the lung bases to the pubic symphysis.  Images were reconstructed in the axial plane. Coronal and sagittal reformatted images  were also provided for further evaluation.  FINDINGS: LOWER CHEST: Unremarkable.  HEPATOBILIARY: Diffuse ill-defined hypodensities throughout the liver may represent areas of focal fatty infiltration. No focal hepatic lesions identified. No biliary ductal dilatation.  GALLBLADDER: Decompressed gallbladder. PANCREAS: Unremarkable. SPLEEN: Unremarkable. ADRENAL GLANDS: Unremarkable. KIDNEYS/URETERS: Punctate calcification at the right UVJ may represent obstructing ureteral calculus. No significant hydronephrosis. No renal masses identified. VASCULATURE: Abdominal aorta within normal limits for patient's age. Portal vein, splenic vein, and SMV are patent. BOWEL/PERITONEUM:Appendix is unremarkable. No bowel obstruction.   No ascites. LYMPH NODES: No adenopathy. BLADDER: Unremarkable. REPRODUCTIVE: Unremarkable. BONES:Multilevel vertebral degenerative changes. SOFT TISSUES: Unremarkable.    IMPRESSION: --Punctate calcification at the right UVJ may represent obstructing ureteral calculus without significant hydronephrosis.  Addendum (Dr.Altun dictating 01/23/2017 10:59 AM) Diffuse ill-defined hypodensities throughout the liver may represent areas of focal fatty infiltration or fibrosis or patchy enhancement.   I have independently reviewed the films from [facility], The images from [facility] are not available.    Assessment & Plan:    1. Right sided pain  - ? Of a stone as pelvic calcifications seen on CT  Renal stone study performed on 01/15/2017 were present on 11/17/2014  - patient will acquire the disc of the CT scan performed at [facility] on 01/22/2017  - Advised to contact our office or seek treatment in the ED if becomes febrile or pain/ vomiting are difficult control in order to arrange for emergent/urgent intervention  Return for patient to [facility] films.  These notes generated with voice recognition software. I apologize for typographical errors.  Michiel Cowboy, PA-C  [facility] [address] [place], [place] [address] [phone]  Addendum:  Reviewed the images from [facility] with Dr. Mena Goes.  Both of [place] could not appreciate an obstructing right UVJ stone.  There is no hydronephrosis and the pelvic calcifications were present and stable on prior CT.  I was also going to review the images with Dr. Apolinar Junes or Dr. Sherryl Barters, but patient came and picked up the disc.

## 2017-01-31 ENCOUNTER — Ambulatory Visit (INDEPENDENT_AMBULATORY_CARE_PROVIDER_SITE_OTHER): Payer: Federal, State, Local not specified - PPO | Admitting: Urology

## 2017-01-31 ENCOUNTER — Other Ambulatory Visit: Payer: Self-pay | Admitting: *Deleted

## 2017-01-31 ENCOUNTER — Encounter: Payer: Self-pay | Admitting: Urology

## 2017-01-31 VITALS — BP 142/90 | HR 76 | Ht 69.0 in | Wt 242.5 lb

## 2017-01-31 DIAGNOSIS — N200 Calculus of kidney: Secondary | ICD-10-CM

## 2017-01-31 DIAGNOSIS — R109 Unspecified abdominal pain: Secondary | ICD-10-CM | POA: Diagnosis not present

## 2017-02-03 ENCOUNTER — Telehealth: Payer: Self-pay | Admitting: Urology

## 2017-02-03 NOTE — Telephone Encounter (Signed)
Please let Mrs. Askey know that I and Dr. Mena Goes have looked at her scan.   The small punctate stone was not appreciated by [place].  I will need to discuss this case further with either Dr. Apolinar Junes or Dr. Sherryl Barters later this week.

## 2017-02-04 NOTE — Telephone Encounter (Signed)
Spoke with patient and she wants to come and get the CD to take to her PCP   Whitney Ruiz

## 2017-02-04 NOTE — Telephone Encounter (Signed)
That will be fine. 

## 2017-02-06 ENCOUNTER — Ambulatory Visit: Payer: Federal, State, Local not specified - PPO | Admitting: Urology

## 2017-02-07 ENCOUNTER — Telehealth: Payer: Self-pay | Admitting: Urology

## 2017-02-07 NOTE — Telephone Encounter (Signed)
Please send my note to Dr. Grandis.  

## 2017-02-07 NOTE — Telephone Encounter (Signed)
CD has been picked up

## 2017-02-13 NOTE — Telephone Encounter (Signed)
faxed

## 2017-05-27 ENCOUNTER — Ambulatory Visit
Admission: EM | Admit: 2017-05-27 | Discharge: 2017-05-27 | Disposition: A | Discharge status: Home / Self Care | Payer: Federal, State, Local not specified - PPO | Attending: Family Medicine | Admitting: Family Medicine

## 2017-05-27 ENCOUNTER — Other Ambulatory Visit: Payer: Self-pay

## 2017-05-27 ENCOUNTER — Encounter: Payer: Self-pay | Admitting: Emergency Medicine

## 2017-05-27 DIAGNOSIS — R42 Dizziness and giddiness: Secondary | ICD-10-CM | POA: Diagnosis not present

## 2017-05-27 DIAGNOSIS — R1110 Vomiting, unspecified: Secondary | ICD-10-CM

## 2017-05-27 DIAGNOSIS — A084 Viral intestinal infection, unspecified: Secondary | ICD-10-CM | POA: Diagnosis not present

## 2017-05-27 DIAGNOSIS — R109 Unspecified abdominal pain: Secondary | ICD-10-CM

## 2017-05-27 LAB — CBC WITH DIFFERENTIAL/PLATELET
BASOS PCT: 1 %
Basophils Absolute: 0.1 10*3/uL (ref 0–0.1)
EOS ABS: 0.1 10*3/uL (ref 0–0.7)
Eosinophils Relative: 1 %
HEMATOCRIT: 42.3 % (ref 35.0–47.0)
HEMOGLOBIN: 13.9 g/dL (ref 12.0–16.0)
LYMPHS ABS: 0.6 10*3/uL — AB (ref 1.0–3.6)
Lymphocytes Relative: 9 %
MCH: 26.7 pg (ref 26.0–34.0)
MCHC: 32.9 g/dL (ref 32.0–36.0)
MCV: 81.2 fL (ref 80.0–100.0)
MONOS PCT: 5 %
Monocytes Absolute: 0.3 10*3/uL (ref 0.2–0.9)
NEUTROS ABS: 5.8 10*3/uL (ref 1.4–6.5)
Neutrophils Relative %: 84 %
Platelets: 288 10*3/uL (ref 150–440)
RBC: 5.21 MIL/uL — ABNORMAL HIGH (ref 3.80–5.20)
RDW: 13.9 % (ref 11.5–14.5)
WBC: 6.9 10*3/uL (ref 3.6–11.0)

## 2017-05-27 LAB — COMPREHENSIVE METABOLIC PANEL
ALBUMIN: 4.2 g/dL (ref 3.5–5.0)
ALK PHOS: 123 U/L (ref 38–126)
ALT: 33 U/L (ref 14–54)
AST: 32 U/L (ref 15–41)
Anion gap: 9 (ref 5–15)
BILIRUBIN TOTAL: 0.6 mg/dL (ref 0.3–1.2)
BUN: 10 mg/dL (ref 6–20)
CALCIUM: 8.8 mg/dL — AB (ref 8.9–10.3)
CO2: 25 mmol/L (ref 22–32)
Chloride: 104 mmol/L (ref 101–111)
Creatinine, Ser: 0.75 mg/dL (ref 0.44–1.00)
GFR calc Af Amer: >60 mL/min (ref >60)
GFR calc non Af Amer: >60 mL/min (ref >60)
GLUCOSE: 113 mg/dL — AB (ref 65–99)
Potassium: 3.6 mmol/L (ref 3.5–5.1)
SODIUM: 138 mmol/L (ref 135–145)
TOTAL PROTEIN: 7.9 g/dL (ref 6.5–8.1)

## 2017-05-27 LAB — LIPASE, BLOOD: Lipase: 23 U/L (ref 11–51)

## 2017-05-27 MED ORDER — ONDANSETRON 8 MG PO TBDP: 8.0000 mg | ORAL_TABLET | Freq: Three times a day (TID) | ORAL | 0 refills | Status: DC | PRN

## 2017-05-27 MED ORDER — ONDANSETRON 8 MG PO TBDP
8.0000 mg | ORAL_TABLET | Freq: Once | ORAL | Status: AC
  Administered 2017-05-27: 8 mg via ORAL

## 2017-05-27 NOTE — ED Triage Notes (Signed)
Patient in today c/o 4 day history of abdominal pain, vomiting, dizziness and general body aches. Patient states the last couple of nights she has been burning up, but hasn't taken her temperature. Patient has tried OTC Advil, lemon and hot water.

## 2017-07-23 ENCOUNTER — Emergency Department
Admission: EM | Admit: 2017-07-23 | Discharge: 2017-07-23 | Disposition: A | Discharge status: Home / Self Care | Payer: Federal, State, Local not specified - PPO | Attending: Emergency Medicine | Admitting: Emergency Medicine

## 2017-07-23 ENCOUNTER — Emergency Department: Payer: Federal, State, Local not specified - PPO

## 2017-07-23 ENCOUNTER — Encounter: Payer: Self-pay | Admitting: Emergency Medicine

## 2017-07-23 ENCOUNTER — Other Ambulatory Visit: Payer: Self-pay

## 2017-07-23 DIAGNOSIS — I4892 Unspecified atrial flutter: Secondary | ICD-10-CM

## 2017-07-23 DIAGNOSIS — R002 Palpitations: Secondary | ICD-10-CM | POA: Diagnosis present

## 2017-07-23 DIAGNOSIS — Z79899 Other long term (current) drug therapy: Secondary | ICD-10-CM | POA: Diagnosis not present

## 2017-07-23 LAB — CBC
HEMATOCRIT: 40.4 % (ref 35.0–47.0)
HEMOGLOBIN: 13.6 g/dL (ref 12.0–16.0)
MCH: 26.7 pg (ref 26.0–34.0)
MCHC: 33.6 g/dL (ref 32.0–36.0)
MCV: 79.3 fL — AB (ref 80.0–100.0)
Platelets: 298 10*3/uL (ref 150–440)
RBC: 5.1 MIL/uL (ref 3.80–5.20)
RDW: 13.6 % (ref 11.5–14.5)
WBC: 4.8 10*3/uL (ref 3.6–11.0)

## 2017-07-23 LAB — TSH: TSH: 2.302 u[IU]/mL (ref 0.350–4.500)

## 2017-07-23 LAB — BASIC METABOLIC PANEL
Anion gap: 13 (ref 5–15)
BUN: 6 mg/dL (ref 6–20)
CO2: 20 mmol/L — AB (ref 22–32)
CREATININE: 0.83 mg/dL (ref 0.44–1.00)
Calcium: 9 mg/dL (ref 8.9–10.3)
Chloride: 103 mmol/L (ref 101–111)
GFR calc Af Amer: >60 mL/min (ref >60)
GFR calc non Af Amer: >60 mL/min (ref >60)
GLUCOSE: 144 mg/dL — AB (ref 65–99)
POTASSIUM: 3 mmol/L — AB (ref 3.5–5.1)
Sodium: 136 mmol/L (ref 135–145)

## 2017-07-23 LAB — TROPONIN I
Troponin I: <0.03 ng/mL (ref <0.03)
Troponin I: <0.03 ng/mL (ref <0.03)

## 2017-07-23 LAB — MAGNESIUM: MAGNESIUM: 1.9 mg/dL (ref 1.7–2.4)

## 2017-07-23 LAB — T4, FREE: FREE T4: 1.23 ng/dL — AB (ref 0.61–1.12)

## 2017-07-23 MED ORDER — RIVAROXABAN 20 MG PO TABS: 20.0000 mg | ORAL_TABLET | Freq: Every day | ORAL | 1 refills | Status: DC

## 2017-07-23 MED ORDER — METOPROLOL TARTRATE 25 MG PO TABS: 12.5000 mg | ORAL_TABLET | Freq: Two times a day (BID) | ORAL | 1 refills | Status: DC

## 2017-07-23 MED ORDER — RIVAROXABAN 20 MG PO TABS
20.0000 mg | ORAL_TABLET | Freq: Once | ORAL | Status: AC
  Administered 2017-07-23: 20 mg via ORAL
  Filled 2017-07-23: qty 1

## 2017-07-23 MED ORDER — IOPAMIDOL (ISOVUE-370) INJECTION 76%
75.0000 mL | Freq: Once | INTRAVENOUS | Status: AC | PRN
  Administered 2017-07-23: 75 mL via INTRAVENOUS

## 2017-07-23 MED ORDER — POTASSIUM CHLORIDE CRYS ER 20 MEQ PO TBCR
40.0000 meq | EXTENDED_RELEASE_TABLET | Freq: Once | ORAL | Status: AC
Start: 2017-07-23 — End: 2017-07-23
  Administered 2017-07-23: 40 meq via ORAL
  Filled 2017-07-23: qty 2

## 2017-07-23 MED ORDER — METOPROLOL TARTRATE 25 MG PO TABS
12.5000 mg | ORAL_TABLET | Freq: Once | ORAL | Status: AC
  Administered 2017-07-23: 12.5 mg via ORAL
  Filled 2017-07-23: qty 1

## 2017-07-23 MED ORDER — MAGNESIUM SULFATE 2 GM/50ML IV SOLN
2.0000 g | Freq: Once | INTRAVENOUS | Status: AC
  Administered 2017-07-23: 2 g via INTRAVENOUS
  Filled 2017-07-23: qty 50

## 2017-07-23 NOTE — ED Provider Notes (Signed)
[facility] Emergency Department Provider Note  ____________________________________________  Time seen: Approximately 9:17 AM  I have reviewed the triage vital signs and the nursing notes.   HISTORY  Chief Complaint Tachycardia   HPI Whitney Ruiz is a 57 y.o. female with a history of sarcoidosis who presents for evaluation of palpitations. Patient reports that she has had similar episodes in the past and they usually resolve after a few minutes. Today the palpitations have been ongoing since 7 AM and wouldn't resolve. Patient denies having chest pain or shortness of breath during this episode. She also denies dizziness. She denies recent illness. When EMS arrived patient was found to be tachycardic with a pulse in the 180s. Initial EKG was concerning for flutter. Patient received adenosine 6 mg followed by 12 mg which slowed down the heart and revealed that the underlying flutter the patient went back into flutter with RVR. Patient then received 2.5 mg of metoprolol and started complaining of pain in her left shoulder. She received 1 nitroglycerin and 324 mg of aspirin. Patient reports that the pain in her shoulder is an ache, moderate, constant since she got into an ambulance. Not worse with movement of the shoulder. She denies dizziness, chest pain, shortness of breath. No personal or family history of heart attacks or blood clots.   Past Medical History:  Diagnosis Date  . Arthritis   . Heartburn   . Kidney stone   . Sarcoidosis     There are no active problems to display for this patient.   Past Surgical History:  Procedure Laterality Date  . ABDOMINAL HYSTERECTOMY    . HAND SURGERY  2009  . KNEE SURGERY Right   . SHOULDER SURGERY  2012    Prior to Admission medications   Medication Sig Start Date End Date Taking? Authorizing Provider  ACETAMINOPHEN PO Take by mouth.    [provider]  bismuth subsalicylate (PEPTO-BISMOL) 262 MG/15ML  suspension Take 30 mLs by mouth 3 (three) times daily. With zantac for 3-5 days may substitute tablets Patient not taking: Reported on 01/15/2017 12/17/16   Hassan RowanWade, Eugene, MD  dicyclomine (BENTYL) 20 MG tablet TAKE ONE TABLET BY MOUTH EVERY 8 HOURS AS NEEDED FOR ABDOMINAL PAIN 01/15/17   [provider]  ketorolac (TORADOL) 10 MG tablet TAKE ONE TABLET BY MOUTH EVERY 6 HOURS AS NEEDED FOR PAIN 01/23/17   [provider]  metoprolol tartrate (LOPRESSOR) 25 MG tablet Take 0.5 tablets (12.5 mg total) by mouth 2 (two) times daily. 07/23/17 07/23/18  Nita SickleVeronese, [place], MD  ondansetron (ZOFRAN ODT) 8 MG disintegrating tablet Take 1 tablet (8 mg total) by mouth every 8 (eight) hours as needed. 05/27/17   Payton Mccallumonty, [place], MD  ranitidine (ZANTAC) 150 MG capsule 1 capsule 3x a day for 3-5 days with pepto Patient not taking: Reported on 01/15/2017 12/17/16   Hassan RowanWade, Eugene, MD  rivaroxaban (XARELTO) 20 MG TABS tablet Take 1 tablet (20 mg total) by mouth daily with supper. 07/23/17   Nita SickleVeronese, [place], MD    Allergies Flagyl [metronidazole]  Family History  Problem Relation Age of Onset  . Healthy Mother   . Cancer Father        lung  . Kidney cancer Neg Hx   . Bladder Cancer Neg Hx     Social History Social History   Tobacco Use  . Smoking status: Never Smoker  . Smokeless tobacco: Never Used  Substance Use Topics  . Alcohol use: Yes  Comment: occasionally  . Drug use: No    Review of Systems  Constitutional: Negative for fever. Eyes: Negative for visual changes. ENT: Negative for sore throat. Neck: No neck pain  Cardiovascular: Negative for chest pain. + tachycardia Respiratory: Negative for shortness of breath. Gastrointestinal: Negative for abdominal pain, vomiting or diarrhea. Genitourinary: Negative for dysuria. Musculoskeletal: Negative for back pain. + L shoulder pain Skin: Negative for rash. Neurological: Negative for headaches, weakness or numbness. Psych: No SI or  HI  ____________________________________________   PHYSICAL EXAM:  VITAL SIGNS: ED Triage Vitals  Enc Vitals Group     BP 07/23/17 0900 (!) 171/114     Pulse Rate 07/23/17 0900 92     Resp 07/23/17 0900 16     Temp 07/23/17 0906 98.1 F (36.7 C)     Temp Source 07/23/17 0906 Oral     SpO2 07/23/17 0900 96 %     Weight 07/23/17 0900 235 lb (106.6 kg)     Height 07/23/17 0900 5\' 9"  (1.753 m)     Head Circumference --      Peak Flow --      Pain Score 07/23/17 0900 8     Pain Loc --      Pain Edu? --      Excl. in GC? --     Constitutional: Alert and oriented. Well appearing and in no apparent distress. HEENT:      Head: Normocephalic and atraumatic.         Eyes: Conjunctivae are normal. Sclera is non-icteric.       Mouth/Throat: Mucous membranes are moist.       Neck: Supple with no signs of meningismus. Cardiovascular: Regular rate and rhythm. No murmurs, gallops, or rubs. 2+ symmetrical distal pulses are present in all extremities. No JVD. Respiratory: Normal respiratory effort. Lungs are clear to auscultation bilaterally. No wheezes, crackles, or rhonchi.  Gastrointestinal: Soft, non tender, and non distended with positive bowel sounds. No rebound or guarding. Musculoskeletal: Nontender with normal range of motion in all extremities. No edema, cyanosis, or erythema of extremities. Neurologic: Normal speech and language. Face is symmetric. Moving all extremities. No gross focal neurologic deficits are appreciated. Skin: Skin is warm, dry and intact. No rash noted. Psychiatric: Mood and affect are normal. Speech and behavior are normal.  ____________________________________________   LABS (all labs ordered are listed, but only abnormal results are displayed)  Labs Reviewed  CBC - Abnormal; Notable for the following components:      Result Value   MCV 79.3 (*)    All other components within normal limits  BASIC METABOLIC PANEL - Abnormal; Notable for the following  components:   Potassium 3.0 (*)    CO2 20 (*)    Glucose, Bld 144 (*)    All other components within normal limits  T4, FREE - Abnormal; Notable for the following components:   Free T4 1.23 (*)    All other components within normal limits  MAGNESIUM  TROPONIN I  TSH  TROPONIN I   ____________________________________________  EKG  ED ECG REPORT I, Nita Sickle, the attending physician, personally viewed and interpreted this ECG.  Normal sinus rhythm, rate of 93, normal intervals, normal axis, frequent PVCs, no ST elevation or depression.  ____________________________________________  RADIOLOGY  Interpreted by me: CXR: Enlarged aorta  CTA chest: normal aorta   Interpretation by Radiologist:  Dg Chest 2 View  Result Date: 07/23/2017 CLINICAL DATA:  Dizziness and palpitations. EXAM: CHEST  2  VIEW COMPARISON:  None. FINDINGS: The heart size is within normal limits. Possible enlargement of the ascending thoracic aorta, best seen on the lateral view. Normal pulmonary vascularity. No focal consolidation, pleural effusion, or pneumothorax. No acute osseous abnormality. IMPRESSION: Possible enlargement of the ascending thoracic aorta, best seen on the lateral view. Recommend CTA chest for further evaluation. Electronically Signed   By: Obie Dredge M.D.   On: 07/23/2017 09:46   Ct Angio Chest Aorta W And/or Wo Contrast  Result Date: 07/23/2017 CLINICAL DATA:  Dizziness, palpitations, possible enlargement of the ascending thoracic aorta on chest radiograph. Known connective tissue disease. EXAM: CT ANGIOGRAPHY CHEST WITH CONTRAST TECHNIQUE: Multidetector CT imaging of the chest was performed using the standard protocol during bolus administration of intravenous contrast. Multiplanar CT image reconstructions and MIPs were obtained to evaluate the vascular anatomy. CONTRAST:  75mL ISOVUE-370 IOPAMIDOL (ISOVUE-370) INJECTION 76% COMPARISON:  PET-CT dated 05/06/2012 FINDINGS:  Cardiovascular: No evidence of thoracic aortic aneurysm or dissection. The heart is normal in size.  No pericardial effusion. Mediastinum/Nodes: Thoracic lymphadenopathy, including: --15 mm short axis low right paratracheal node (series 4/image 35) --16 mm short axis right perihilar node (series 4/image 43) --16 mm short axis subcarinal node (series 4/image 45) These findings are similar to prior PET-CT in 2013. Given the chronic appearance, a systemic process is favored. Visualized thyroid is unremarkable. Lungs/Pleura: Mild patchy/nodular opacity in the posterior right upper lobe (series 5/image 30), measuring up to 9 mm, similar to 2013, benign. Additional nodular scarring in the lateral left lung apex (series 5/image 19), mildly progressive from 2013. 5 mm irregular nodule in the left upper lobe (series 5/image 47), previously 7 mm. Additional peribronchovascular nodularity in the bilateral upper lobes, with associated mild interlobular septal thickening, mildly progressive. Overall, these findings favor inflammatory process such as sarcoidosis. Mild linear scarring/atelectasis in the right middle lobe. No focal consolidation. No pleural effusion or pneumothorax. Upper Abdomen: Visualized upper abdomen is unremarkable. Musculoskeletal: Visualized osseous structures are within normal limits. Review of the MIP images confirms the above findings. IMPRESSION: No evidence of thoracic aortic aneurysm or dissection. Peribronchovascular nodularity in the bilateral upper lobes, mildly progressive from 2013. Associated thoracic lymphadenopathy, grossly unchanged. Overall, this appearance favors a systemic inflammatory process such as sarcoidosis. Electronically Signed   By: Charline Bills M.D.   On: 07/23/2017 10:54      ____________________________________________   PROCEDURES  Procedure(s) performed: None Procedures Critical Care performed:  None ____________________________________________   INITIAL  IMPRESSION / ASSESSMENT AND PLAN / ED COURSE  57 y.o. female with a history of sarcoidosis who presents for evaluation of palpitations. Symptoms like patient has been having these episodes for a while now. Today EMS was able to catch a flutter on EKG. We'll check electrolytes, blood work to rule out anemia or thyroid dysfunction, troponin to rule out ischemia. After receiving 2.5 mg of metoprolol per EMS patient is now in normal sinus rhythm. We'll give a dose of IV magnesium to help prevent patient from going back into flutter. We'll monitor on telemetry. Will discuss with cardiology.    _________________________ 12:54 PM on 07/23/2017 -----------------------------------------  Patient remains in normal sinus rhythm. She was started on a metoprolol and Xarelto after consultation with Dr. Mariah Milling. She has tolerated both medications well and HR is in the 70s. She was given K and Mag supplementation here. Dr. Mariah Milling will ensure proper follow up for patient in 1-2 days. Labs with no evidence of dehydration, anemia, thyroid dysfunction. Troponin 2 is  negative. Patient remains well appearing. Discussed return precautions for any signs of tachycardia, palpitations, dizziness or chest pain. Patient was provided with a coupon for Xarelto. I discussed with patient the importance of taking medications as prescribed. Patient stable for dc at this time.   As part of my medical decision making, I reviewed the following data within the electronic MEDICAL RECORD NUMBER Nursing notes reviewed and incorporated, Labs reviewed , EKG interpreted , Radiograph reviewed , A consult was requested and obtained from this/these consultant(s) Cardiology, Notes from prior ED visits and [place] Controlled Substance Database    Pertinent labs & imaging results that were available during my care of the patient were reviewed by me and considered in my medical decision making (see chart for  details).    ____________________________________________   FINAL CLINICAL IMPRESSION(S) / ED DIAGNOSES  Final diagnoses:  New onset atrial flutter (HCC)      NEW MEDICATIONS STARTED DURING THIS VISIT:  ED Discharge Orders        Ordered    metoprolol tartrate (LOPRESSOR) 25 MG tablet  2 times daily     07/23/17 1245    rivaroxaban (XARELTO) 20 MG TABS tablet  Daily with supper     07/23/17 1245       Note:  This document was prepared using Dragon voice recognition software and may include unintentional dictation errors.    Don Perking, [place], MD 07/23/17 1256

## 2017-07-23 NOTE — Discharge Instructions (Signed)
Take metoprolol and Xarelto as prescribed. Follow up with Dr. Mariah MillingGollan in 2 days. Return to the ER if you have palpitations, chest pain, or dizziness.

## 2017-07-23 NOTE — ED Notes (Signed)
Pt taken off of bedpan, saturated sheets with urine. Pt cleaned and clean sheets placed on bed. Call light on bed rail.

## 2017-07-23 NOTE — ED Notes (Signed)
Pt up to bedside commode with steady gait noted

## 2017-07-23 NOTE — ED Triage Notes (Signed)
Pt to ED via EMS from home with c/o dizziness, per EMS pt was HR 180s upon arrival. PT was given 6 and 12mg  of adenosine, 2.5mg  of metoprolol , 1 nitro and 324 Asprin. Pt HR 90s at this time, A&Ox4, MD at bedside

## 2017-07-23 NOTE — ED Notes (Signed)
Per Dr. Don PerkingVeronese patient to be given food prior to discharge. Pt states that she does not feel well. Gave sandwich tray and drink. Will continue to monitor.

## 2017-07-25 ENCOUNTER — Encounter: Payer: Self-pay | Admitting: Emergency Medicine

## 2017-07-25 ENCOUNTER — Emergency Department
Admission: EM | Admit: 2017-07-25 | Discharge: 2017-07-25 | Disposition: A | Discharge status: Home / Self Care | Payer: Federal, State, Local not specified - PPO | Attending: Emergency Medicine | Admitting: Emergency Medicine

## 2017-07-25 ENCOUNTER — Telehealth: Payer: Self-pay

## 2017-07-25 DIAGNOSIS — R1084 Generalized abdominal pain: Secondary | ICD-10-CM | POA: Insufficient documentation

## 2017-07-25 DIAGNOSIS — Z79899 Other long term (current) drug therapy: Secondary | ICD-10-CM | POA: Insufficient documentation

## 2017-07-25 DIAGNOSIS — I480 Paroxysmal atrial fibrillation: Secondary | ICD-10-CM | POA: Insufficient documentation

## 2017-07-25 DIAGNOSIS — R002 Palpitations: Secondary | ICD-10-CM | POA: Insufficient documentation

## 2017-07-25 DIAGNOSIS — R109 Unspecified abdominal pain: Secondary | ICD-10-CM

## 2017-07-25 LAB — COMPREHENSIVE METABOLIC PANEL
ALT: 32 U/L (ref 14–54)
AST: 36 U/L (ref 15–41)
Albumin: 3.7 g/dL (ref 3.5–5.0)
Alkaline Phosphatase: 111 U/L (ref 38–126)
Anion gap: 10 (ref 5–15)
BUN: 9 mg/dL (ref 6–20)
CHLORIDE: 110 mmol/L (ref 101–111)
CO2: 20 mmol/L — ABNORMAL LOW (ref 22–32)
Calcium: 9 mg/dL (ref 8.9–10.3)
Creatinine, Ser: 0.75 mg/dL (ref 0.44–1.00)
Glucose, Bld: 106 mg/dL — ABNORMAL HIGH (ref 65–99)
POTASSIUM: 3.7 mmol/L (ref 3.5–5.1)
Sodium: 140 mmol/L (ref 135–145)
Total Bilirubin: 0.8 mg/dL (ref 0.3–1.2)
Total Protein: 7.1 g/dL (ref 6.5–8.1)

## 2017-07-25 LAB — URINALYSIS, COMPLETE (UACMP) WITH MICROSCOPIC
Glucose, UA: NEGATIVE mg/dL
KETONES UR: 40 mg/dL — AB
Leukocytes, UA: NEGATIVE
Nitrite: NEGATIVE
PROTEIN: NEGATIVE mg/dL
Specific Gravity, Urine: 1.02 (ref 1.005–1.030)
pH: 6 (ref 5.0–8.0)

## 2017-07-25 LAB — CBC
HCT: 40 % (ref 35.0–47.0)
HEMOGLOBIN: 13.4 g/dL (ref 12.0–16.0)
MCH: 26.5 pg (ref 26.0–34.0)
MCHC: 33.4 g/dL (ref 32.0–36.0)
MCV: 79.5 fL — AB (ref 80.0–100.0)
PLATELETS: 320 10*3/uL (ref 150–440)
RBC: 5.03 MIL/uL (ref 3.80–5.20)
RDW: 13.5 % (ref 11.5–14.5)
WBC: 6.4 10*3/uL (ref 3.6–11.0)

## 2017-07-25 LAB — TROPONIN I

## 2017-07-25 LAB — MAGNESIUM: Magnesium: 2.2 mg/dL (ref 1.7–2.4)

## 2017-07-25 LAB — LIPASE, BLOOD: Lipase: 28 U/L (ref 11–51)

## 2017-07-25 NOTE — ED Provider Notes (Signed)
Patient does feel improved, but she reports that she is concerned she needs a sooner appointment with cardiology.  I have spoken with Dr. Windell HummingbirdGollan's nurse, Dr. Mariah MillingGollan doing procedure, and they will call her to schedule a much earlier appointment/sooner appointment for her today.  Patient is currently on her way to her 10 AM primary care appointment for follow-up.  She is awake alert, in no distress.  Being accompanied by family.  Understanding of her plan for follow-up with Dr. Mariah MillingGollan, also going to see her primary doctor today.   Whitney Ruiz, [place], MD 07/25/17 [phone]

## 2017-07-25 NOTE — ED Notes (Signed)
Went in to introduce self to pt. Pt expressed concern about being discharged. Reports still feeling dizzy. Vital signs obtained. WNL. bp 146/72. HR 80. 100% r/a. Talked to charge RN and MD Quale. Dr. Fanny BienQuale will go in and talk to patient

## 2017-07-25 NOTE — ED Notes (Signed)
Dr. Fanny BienQuale talked to to pt and cardiologist. Pt will follow up with pcp today at 1000.

## 2017-07-25 NOTE — ED Provider Notes (Signed)
[facility] Emergency Department Provider Note  ____________________________________________   I have reviewed the triage vital signs and the nursing notes.   HISTORY  Chief Complaint Palpitations  History limited by: Not Limited   HPI Whitney Ruiz is a 57 y.o. female who presents to the emergency department today via EMS with primary complaint of palpitations. She states that they started again tonight. Was seen in the emergency department two days ago and diagnosed with afib. Tonight she felt like her heart was racing and that she was going to pass out. EMS noted afib with RVR. The patient was given medication which resolved the afib. At the time of my examination she no longer felt palpitations. She has secondary complaint of abdominal pain. This has been present for months. She has seen a GI doctor for this.     Per medical record review patient has a history of abdominal pain, is following with a GI doctor last saw them 9 days ago.   Past Medical History:  Diagnosis Date  . Arthritis   . Heartburn   . Kidney stone   . Sarcoidosis     There are no active problems to display for this patient.   Past Surgical History:  Procedure Laterality Date  . ABDOMINAL HYSTERECTOMY    . HAND SURGERY  2009  . KNEE SURGERY Right   . SHOULDER SURGERY  2012    Prior to Admission medications   Medication Sig Start Date End Date Taking? Authorizing Provider  ACETAMINOPHEN PO Take by mouth.    [provider]  bismuth subsalicylate (PEPTO-BISMOL) 262 MG/15ML suspension Take 30 mLs by mouth 3 (three) times daily. With zantac for 3-5 days may substitute tablets Patient not taking: Reported on 01/15/2017 12/17/16   Hassan RowanWade, [place], MD  dicyclomine (BENTYL) 20 MG tablet TAKE ONE TABLET BY MOUTH EVERY 8 HOURS AS NEEDED FOR ABDOMINAL PAIN 01/15/17   [provider]  ketorolac (TORADOL) 10 MG tablet TAKE ONE TABLET BY MOUTH EVERY 6 HOURS AS NEEDED FOR PAIN  01/23/17   [provider]  metoprolol tartrate (LOPRESSOR) 25 MG tablet Take 0.5 tablets (12.5 mg total) by mouth 2 (two) times daily. 07/23/17 07/23/18  Nita SickleVeronese, [place], MD  ondansetron (ZOFRAN ODT) 8 MG disintegrating tablet Take 1 tablet (8 mg total) by mouth every 8 (eight) hours as needed. 05/27/17   Payton Mccallumonty, [place], MD  ranitidine (ZANTAC) 150 MG capsule 1 capsule 3x a day for 3-5 days with pepto Patient not taking: Reported on 01/15/2017 12/17/16   Hassan RowanWade, [place], MD  rivaroxaban (XARELTO) 20 MG TABS tablet Take 1 tablet (20 mg total) by mouth daily with supper. 07/23/17   Nita SickleVeronese, [place], MD    Allergies Flagyl [metronidazole]  Family History  Problem Relation Age of Onset  . Healthy Mother   . Cancer Father        lung  . Kidney cancer Neg Hx   . Bladder Cancer Neg Hx     Social History Social History   Tobacco Use  . Smoking status: Never Smoker  . Smokeless tobacco: Never Used  Substance Use Topics  . Alcohol use: Yes    Comment: occasionally  . Drug use: No    Review of Systems Constitutional: No fever/chills Eyes: No visual changes. ENT: No sore throat. Cardiovascular: Denies chest pain. Positive for palpitations.  Respiratory: Denies shortness of breath. Gastrointestinal: Positive for abdominal pain. Genitourinary: Negative for dysuria. Musculoskeletal: Negative for back pain. Skin: Negative for rash. Neurological: Negative  for headaches, focal weakness or numbness.  ____________________________________________   PHYSICAL EXAM:  VITAL SIGNS: ED Triage Vitals [07/25/17 0128]  Enc Vitals Group     BP      Pulse      Resp      Temp 98.2 F (36.8 C)     Temp Source Oral     SpO2      Weight 235 lb (106.6 kg)    Constitutional: Alert and oriented. Well appearing and in no distress. Eyes: Conjunctivae are normal.  ENT   Head: Normocephalic and atraumatic.   Nose: No congestion/rhinnorhea.   Mouth/Throat: Mucous membranes are  moist.   Neck: No stridor. Hematological/Lymphatic/Immunilogical: No cervical lymphadenopathy. Cardiovascular: Normal rate, regular rhythm.  No murmurs, rubs, or gallops.  Respiratory: Normal respiratory effort without tachypnea nor retractions. Breath sounds are clear and equal bilaterally. No wheezes/rales/rhonchi. Gastrointestinal: Soft and non tender. No rebound. No guarding.  Genitourinary: Deferred Musculoskeletal: Normal range of motion in all extremities. No lower extremity edema. Neurologic:  Normal speech and language. No gross focal neurologic deficits are appreciated.  Skin:  Skin is warm, dry and intact. No rash noted. Psychiatric: Mood and affect are normal. Speech and behavior are normal. Patient exhibits appropriate insight and judgment.  ____________________________________________    LABS (pertinent positives/negatives)  UA not consistent with infection CMP glu 106, cr 0.75 Mg 2.2 Trop <0.03 Lipase 28 Cbc wbc 6.4, hgb 13.4, plt 320  ____________________________________________   EKG  I, Phineas Semen, attending physician, personally viewed and interpreted this EKG  EKG Time: 0130 Rate: 95 Rhythm: sinus rhythm with ventricular bigeminy Axis: normal Intervals: qtc 406 QRS: narrow ST changes: no st elevation Impression: abnormal ekg  ____________________________________________    RADIOLOGY  None  ____________________________________________   PROCEDURES  Procedures  ____________________________________________   INITIAL IMPRESSION / ASSESSMENT AND PLAN / ED COURSE  Pertinent labs & imaging results that were available during my care of the patient were reviewed by me and considered in my medical decision making (see chart for details).  Patient presented to the emergency department today because of concerns for palpitations.  Initially for EMS patient was in A. fib with RVR, initial rhythm upon presentation the emergency department was  sinus with a ventricular bigeminy.  However during her stay here in the emergency department she was monitored in was primarily in sinus rhythm without frequent PVCs.  Blood work without any concerning findings that would explain the patient's palpitations or weakness. Did discuss findings with the patient. I did discuss with patient about importance of following up with cardiology as well as her primary care doctor.  ____________________________________________   FINAL CLINICAL IMPRESSION(S) / ED DIAGNOSES  Final diagnoses:  Palpitations  Abdominal pain, unspecified abdominal location  Paroxysmal atrial fibrillation [facility])     Note: This dictation was prepared with Dragon dictation. Any transcriptional errors that result from this process are unintentional     Phineas Semen, MD 07/25/17 [phone]

## 2017-07-25 NOTE — ED Triage Notes (Addendum)
Pt arrived to ED via EMS from home where EMS reports pt called out for generalized weakness, dizziness and N/V since this AM. Pt diagnosed with Afib on Wednesday here in ED. Pt in route in A-Fib at rate of 130bpm. Pt given metoprolol as well as Zofran in route by EMS. Pt is A&O x4 on arrival with HR of 84 on monitor, bigeminy rhythm. MD at bedside for further evaluation.

## 2017-07-25 NOTE — Telephone Encounter (Signed)
Lmov for patient  Had sooner appointments   Will try again at a later time

## 2017-07-25 NOTE — ED Notes (Signed)
Pt reports that when she got home she drank some water mixed with 1 tsp of Epsom Salt because she heard "it would help her have a BM".

## 2017-07-25 NOTE — Discharge Instructions (Signed)
Please seek medical attention for any high fevers, chest pain, shortness of breath, change in behavior, persistent vomiting, bloody stool or any other new or concerning symptoms.  

## 2017-07-29 ENCOUNTER — Telehealth: Payer: Self-pay

## 2017-07-29 NOTE — Telephone Encounter (Signed)
-----   Message from Bryna ColanderPamela S Allen, RN sent at 07/29/2017  8:10 AM EST ----- Regarding: FW: er Message from Dr. Mariah MillingGollan to see if we could add this patient on 2/21.   Thanks ----- Message ----- From: Antonieta IbaGollan, Timothy J, MD Sent: 07/25/2017  10:57 AM To: Bryna ColanderPamela S Allen, RN, Coralee RudSabrina F Gilley Subject: er                                             Can we move this lady to 2/21 with me at 11:40 to 2 pm? Twice in the Er in 2 days thx TG

## 2017-07-29 NOTE — Telephone Encounter (Signed)
Lmov for patient to be seen sooner.   °

## 2017-08-04 NOTE — Telephone Encounter (Signed)
Lmov for patient to call and be seen sooner

## 2017-08-06 NOTE — ED Provider Notes (Signed)
[facility]    CSN: [id number] Arrival date & time: 05/27/17  0804     History   Chief Complaint Chief Complaint  Patient presents with  . Abdominal Pain    HPI Verlin DikeLoretta F Christenberry is a 57 y.o. female.   The history is provided by the patient.  Abdominal Pain  Pain location:  Generalized Pain quality: aching   Pain radiates to:  Does not radiate Onset quality:  Sudden Timing:  Constant Progression:  Waxing and waning Chronicity:  New Context: sick contacts   Context: not alcohol use, not awakening from sleep, not diet changes, not eating, not laxative use, not medication withdrawal, not previous surgeries, not recent illness, not recent sexual activity, not recent travel, not retching, not suspicious food intake and not trauma   Relieved by:  Nothing Ineffective treatments:  OTC medications Associated symptoms: fatigue and vomiting   Associated symptoms: no anorexia, no belching, no chest pain, no chills, no constipation, no cough, no diarrhea, no dysuria, no fever, no flatus, no hematemesis, no hematochezia, no hematuria, no melena, no nausea, no shortness of breath and no sore throat   Risk factors: no alcohol abuse, no aspirin use, not elderly, no NSAID use, not obese, not pregnant and no recent hospitalization     Past Medical History:  Diagnosis Date  . Arthritis   . Heartburn   . Kidney stone   . Sarcoidosis     There are no active problems to display for this patient.   Past Surgical History:  Procedure Laterality Date  . ABDOMINAL HYSTERECTOMY    . HAND SURGERY  2009  . KNEE SURGERY Right   . SHOULDER SURGERY  2012    OB History    No data available       Home Medications    Prior to Admission medications   Medication Sig Start Date End Date Taking? Authorizing Provider  ACETAMINOPHEN PO Take by mouth.    [provider]  bismuth subsalicylate (PEPTO-BISMOL) 262 MG/15ML suspension Take 30 mLs by mouth 3 (three) times daily.  With zantac for 3-5 days may substitute tablets Patient not taking: Reported on 01/15/2017 12/17/16   Hassan RowanWade, Eugene, MD  dicyclomine (BENTYL) 20 MG tablet TAKE ONE TABLET BY MOUTH EVERY 8 HOURS AS NEEDED FOR ABDOMINAL PAIN 01/15/17   [provider]  ketorolac (TORADOL) 10 MG tablet TAKE ONE TABLET BY MOUTH EVERY 6 HOURS AS NEEDED FOR PAIN 01/23/17   [provider]  metoprolol tartrate (LOPRESSOR) 25 MG tablet Take 0.5 tablets (12.5 mg total) by mouth 2 (two) times daily. 07/23/17 07/23/18  Nita SickleVeronese, [place], MD  ondansetron (ZOFRAN ODT) 8 MG disintegrating tablet Take 1 tablet (8 mg total) by mouth every 8 (eight) hours as needed. 05/27/17   Payton Mccallumonty, Eunique Balik, MD  ranitidine (ZANTAC) 150 MG capsule 1 capsule 3x a day for 3-5 days with pepto Patient not taking: Reported on 01/15/2017 12/17/16   Hassan RowanWade, Eugene, MD  rivaroxaban (XARELTO) 20 MG TABS tablet Take 1 tablet (20 mg total) by mouth daily with supper. 07/23/17   Nita SickleVeronese, [place], MD    Family History Family History  Problem Relation Age of Onset  . Healthy Mother   . Cancer Father        lung  . Kidney cancer Neg Hx   . Bladder Cancer Neg Hx     Social History Social History   Tobacco Use  . Smoking status: Never Smoker  . Smokeless tobacco: Never Used  Substance  Use Topics  . Alcohol use: Yes    Comment: occasionally  . Drug use: No     Allergies   Flagyl [metronidazole]   Review of Systems Review of Systems  Constitutional: Positive for fatigue. Negative for chills and fever.  HENT: Negative for sore throat.   Respiratory: Negative for cough and shortness of breath.   Cardiovascular: Negative for chest pain.  Gastrointestinal: Positive for abdominal pain and vomiting. Negative for anorexia, constipation, diarrhea, flatus, hematemesis, hematochezia, melena and nausea.  Genitourinary: Negative for dysuria and hematuria.     Physical Exam Triage Vital Signs ED Triage Vitals  Enc Vitals Group     BP  05/27/17 0823 132/65     Pulse Rate 05/27/17 0823 (!) 56     Resp 05/27/17 0823 16     Temp 05/27/17 0823 98 F (36.7 C)     Temp Source 05/27/17 0823 Oral     SpO2 05/27/17 0823 100 %     Weight 05/27/17 0822 230 lb (104.3 kg)     Height 05/27/17 0822 5\' 9"  (1.753 m)     Head Circumference --      Peak Flow --      Pain Score 05/27/17 0824 7     Pain Loc --      Pain Edu? --      Excl. in GC? --    No data found.  Updated Vital Signs BP 132/65 (BP Location: Left Arm)   Pulse (!) 56   Temp 98 F (36.7 C) (Oral)   Resp 16   Ht 5\' 9"  (1.753 m)   Wt 230 lb (104.3 kg)   SpO2 100%   BMI 33.97 kg/m   Visual Acuity Right Eye Distance:   Left Eye Distance:   Bilateral Distance:    Right Eye Near:   Left Eye Near:    Bilateral Near:     Physical Exam  Constitutional: She appears well-developed and well-nourished. No distress.  Cardiovascular: Normal rate, regular rhythm and normal heart sounds.  Pulmonary/Chest: Effort normal and breath sounds normal. No stridor. No respiratory distress. She has no wheezes.  Abdominal: Soft. Bowel sounds are normal. She exhibits no distension and no mass. There is tenderness (mild, diffuse). There is no rebound and no guarding.  Skin: She is not diaphoretic.  Nursing note and vitals reviewed.    UC Treatments / Results  Labs (all labs ordered are listed, but only abnormal results are displayed) Labs Reviewed  COMPREHENSIVE METABOLIC PANEL - Abnormal; Notable for the following components:      Result Value   Glucose, Bld 113 (*)    Calcium 8.8 (*)    All other components within normal limits  CBC WITH DIFFERENTIAL/PLATELET - Abnormal; Notable for the following components:   RBC 5.21 (*)    Lymphs Abs 0.6 (*)    All other components within normal limits  LIPASE, BLOOD    EKG  EKG Interpretation None       Radiology No results found.  Procedures ED EKG Date/Time: 08/06/2017 8:57 PM Performed by: Payton Mccallum,  MD Authorized by: Payton Mccallum, MD   ECG reviewed by ED Physician in the absence of a cardiologist: yes   Previous ECG:    Previous ECG:  Compared to current   Similarity:  No change Interpretation:    Interpretation: abnormal   Rate:    ECG rate assessment: normal   Rhythm:    Rhythm: sinus rhythm   Ectopy:  Ectopy: PVCs   QRS:    QRS axis:  Normal Conduction:    Conduction: normal   ST segments:    ST segments:  Normal   (including critical care time)  Medications Ordered in UC Medications  ondansetron (ZOFRAN-ODT) disintegrating tablet 8 mg (8 mg Oral Given 05/27/17 0902)     Initial Impression / Assessment and Plan / UC Course  I have reviewed the triage vital signs and the nursing notes.  Pertinent labs & imaging results that were available during my care of the patient were reviewed by me and considered in my medical decision making (see chart for details).       Final Clinical Impressions(s) / UC Diagnoses   Final diagnoses:  Viral gastroenteritis    ED Discharge Orders        Ordered    ondansetron (ZOFRAN ODT) 8 MG disintegrating tablet  Every 8 hours PRN     05/27/17 1005     1. Lab results and diagnosis reviewed with patient; patient given zofran 8mg  odt with improvement of symptoms 2. rx as per orders above; reviewed possible side effects, interactions, risks and benefits  3. Recommend supportive treatment with clear liquids/increased fluids then advance slowly as tolerated 4. Follow-up with PCP 5. Follow up prn  Controlled Substance Prescriptions [place] Controlled Substance Registry consulted? Not Applicable   Payton Mccallum, MD 08/06/17 2103

## 2017-08-07 NOTE — Telephone Encounter (Signed)
Patient states [facility] was [facility] and [facility]  appt cancelled and does not want to r/s

## 2017-08-12 ENCOUNTER — Ambulatory Visit: Payer: Federal, State, Local not specified - PPO | Admitting: Nurse Practitioner

## 2017-11-30 ENCOUNTER — Other Ambulatory Visit: Payer: Self-pay

## 2017-11-30 ENCOUNTER — Encounter: Payer: Self-pay | Admitting: Emergency Medicine

## 2017-11-30 ENCOUNTER — Emergency Department
Admission: EM | Admit: 2017-11-30 | Discharge: 2017-11-30 | Disposition: A | Discharge status: Home / Self Care | Payer: Federal, State, Local not specified - PPO | Attending: Emergency Medicine | Admitting: Emergency Medicine

## 2017-11-30 DIAGNOSIS — Z79899 Other long term (current) drug therapy: Secondary | ICD-10-CM | POA: Diagnosis not present

## 2017-11-30 DIAGNOSIS — F17220 Nicotine dependence, chewing tobacco, uncomplicated: Secondary | ICD-10-CM | POA: Insufficient documentation

## 2017-11-30 DIAGNOSIS — Z950 Presence of cardiac pacemaker: Secondary | ICD-10-CM | POA: Insufficient documentation

## 2017-11-30 DIAGNOSIS — R51 Headache: Secondary | ICD-10-CM | POA: Insufficient documentation

## 2017-11-30 DIAGNOSIS — R519 Headache, unspecified: Secondary | ICD-10-CM

## 2017-11-30 HISTORY — DX: Unspecified atrial fibrillation: I48.91

## 2017-11-30 MED ORDER — BUTALBITAL-APAP-CAFFEINE 50-325-40 MG PO TABS: 1.0000 | ORAL_TABLET | Freq: Four times a day (QID) | ORAL | 0 refills | Status: AC | PRN

## 2017-11-30 MED ORDER — METOCLOPRAMIDE HCL 5 MG/ML IJ SOLN
20.0000 mg | Freq: Once | INTRAVENOUS | Status: AC
  Administered 2017-11-30: 20 mg via INTRAVENOUS
  Filled 2017-11-30: qty 4

## 2017-11-30 MED ORDER — SODIUM CHLORIDE 0.9 % IV SOLN
1000.0000 mL | Freq: Once | INTRAVENOUS | Status: AC
  Administered 2017-11-30: 1000 mL via INTRAVENOUS

## 2017-11-30 MED ORDER — DIPHENHYDRAMINE HCL 50 MG/ML IJ SOLN
25.0000 mg | Freq: Once | INTRAMUSCULAR | Status: AC
  Administered 2017-11-30: 25 mg via INTRAVENOUS
  Filled 2017-11-30: qty 1

## 2017-11-30 MED ORDER — KETOROLAC TROMETHAMINE 30 MG/ML IJ SOLN
30.0000 mg | Freq: Once | INTRAMUSCULAR | Status: AC
  Administered 2017-11-30: 30 mg via INTRAVENOUS
  Filled 2017-11-30: qty 1

## 2017-11-30 NOTE — ED Triage Notes (Addendum)
Pt arrived from home via EMS and brought to waiting room in wheelchair; pt c/o headache to the left side of her head, intermittently, for 1 month; pt says pain increases when she turns her head to the left; reports nausea; pain is always to the left side of her head; denies fall/injury when headache pain started; says she's seen her primary for this and they don't know what's wrong; pt awake and alert, talking in complete coherent sentences

## 2017-11-30 NOTE — ED Provider Notes (Signed)
[facility] Emergency Department Provider Note   ____________________________________________    I have reviewed the triage vital signs and the nursing notes.   HISTORY  Chief Complaint Headache     HPI Whitney Ruiz is a 57 y.o. female with a history of sarcoidosis, atrial fibrillation who presents with 1 month of intermittent left-sided headache which she describes as burning and tingling primarily in moderate in nature.  Nothing seems to make this better.  No neuro deficits.  Occasionally the headache is severe enough to make her nauseated.  No history of migraines.  No trauma.  [facility] which [facility].  No fevers or chills  Past Medical History:  Diagnosis Date  . Arthritis   . Atrial fibrillation (HCC)   . Heartburn   . Kidney stone   . Sarcoidosis     There are no active problems to display for this patient.   Past Surgical History:  Procedure Laterality Date  . ABDOMINAL HYSTERECTOMY    . HAND SURGERY  2009  . KNEE SURGERY Right   . PACEMAKER INSERTION    . SHOULDER SURGERY  2012    Prior to Admission medications   Medication Sig Start Date End Date Taking? Authorizing Provider  ACETAMINOPHEN PO Take 325-500 mg by mouth daily as needed (pain).    Yes [provider]  diltiazem (CARDIZEM) 30 MG tablet Take 30 mg by mouth every 6 (six) hours as needed (for palpitations woth heart rate over 100 bpm.).    Yes [provider]  sotalol (BETAPACE) 80 MG tablet Take 80 mg by mouth 2 (two) times daily.   Yes [provider]  butalbital-acetaminophen-caffeine (FIORICET, ESGIC) [phone] MG tablet Take 1-2 tablets by mouth every 6 (six) hours as needed for headache. 11/30/17 11/30/18  Jene EveryKinner, Darry Kelnhofer, MD     Allergies Flagyl [metronidazole]  Family History  Problem Relation Age of Onset  . Healthy Mother   . Cancer Father        lung  . Kidney cancer Neg Hx   . Bladder Cancer Neg  Hx     Social History Social History   Tobacco Use  . Smoking status: Never Smoker  . Smokeless tobacco: Current User    Types: Snuff  Substance Use Topics  . Alcohol use: Not Currently  . Drug use: No    Review of Systems  Constitutional: No fever/chills Eyes: No visual changes.  ENT: No sore throat. Cardiovascular: Denies chest pain. Respiratory: Denies cough Gastrointestinal: No abdominal pain   Genitourinary: Negative for dysuria. Musculoskeletal: Negative for back pain. Skin: Negative for rash. Neurological: As above   ____________________________________________   PHYSICAL EXAM:  VITAL SIGNS: ED Triage Vitals [11/30/17 0439]  Enc Vitals Group     BP 119/84     Pulse Rate 64     Resp 17     Temp 98.6 F (37 C)     Temp Source Oral     SpO2 99 %     Weight 94.3 kg (208 lb)     Height 1.753 m (5\' 9" )     Head Circumference      Peak Flow      Pain Score 10     Pain Loc      Pain Edu?      Excl. in GC?     Constitutional: Alert and oriented.   Eyes: Conjunctivae are normal.  Erler, EOMI Head: Atraumatic.  No temporal tenderness Nose: No congestion/rhinnorhea. Mouth/Throat: Mucous membranes are moist.   Neck:  Painless ROM Cardiovascular: Normal rate, regular rhythm.  Good peripheral circulation. Respiratory: Normal respiratory effort.  No retractions.  Gastrointestinal: Soft and nontender. No distention.    Musculoskeletal:   Warm and well perfused Neurologic:  Normal speech and language. No gross focal neurologic deficits are appreciated.  Cranial nerves II through XII are normal Skin:  Skin is warm, dry and intact. No rash noted. Psychiatric: Mood and affect are normal. Speech and behavior are normal.  ____________________________________________   LABS (all labs ordered are listed, but only abnormal results are displayed)  Labs Reviewed - No data to  display ____________________________________________  EKG  None ____________________________________________  RADIOLOGY  None ____________________________________________   PROCEDURES  Procedure(s) performed: No  Procedures   Critical Care performed: No ____________________________________________   INITIAL IMPRESSION / ASSESSMENT AND PLAN / ED COURSE  Pertinent labs & imaging results that were available during my care of the patient were reviewed by me and considered in my medical decision making (see chart for details).  She presents with intermittent headaches over the last month and possibly longer.  She has not been able to establish a pattern to these headaches.  Generally left-sided in nature.  Occasionally makes her nauseated, suspicious for migrainous headaches versus tension headaches.  Will treat with IV Reglan, IV Benadryl and IV Toradol and reevaluate  Patient reports significant improvement after treatment.  Although she still has a mild headache she would like to go home and sleep but she thinks this will improve it.  She will follow-up with her doctor as well as ENT as she describes some vertiginous type symptoms over the last month.  Will Rx Fioricet, close follow-up as described.  Return precautions discussed    ____________________________________________   FINAL CLINICAL IMPRESSION(S) / ED DIAGNOSES  Final diagnoses:  Nonintractable headache, unspecified chronicity pattern, unspecified headache type        Note:  This document was prepared using Dragon voice recognition software and may include unintentional dictation errors.    Jene Every, MD 11/30/17 1123

## 2017-11-30 NOTE — ED Notes (Signed)
Pt assisted to toilet to void 

## 2017-11-30 NOTE — ED Notes (Signed)
ED Provider at bedside. 

## 2017-11-30 NOTE — ED Notes (Signed)
Family at bedside. 

## 2018-04-10 ENCOUNTER — Ambulatory Visit
Admission: RE | Admit: 2018-04-10 | Discharge: 2018-04-10 | Disposition: A | Discharge status: Home / Self Care | Payer: Federal, State, Local not specified - PPO | Source: Ambulatory Visit | Attending: Family Medicine | Admitting: Family Medicine

## 2018-04-10 ENCOUNTER — Other Ambulatory Visit: Payer: Self-pay | Admitting: Family Medicine

## 2018-04-10 DIAGNOSIS — M5137 Other intervertebral disc degeneration, lumbosacral region: Secondary | ICD-10-CM | POA: Diagnosis not present

## 2018-04-10 DIAGNOSIS — G8929 Other chronic pain: Secondary | ICD-10-CM | POA: Diagnosis not present

## 2018-04-10 DIAGNOSIS — R52 Pain, unspecified: Secondary | ICD-10-CM

## 2018-04-10 DIAGNOSIS — M545 Low back pain: Secondary | ICD-10-CM | POA: Diagnosis not present

## 2018-04-10 DIAGNOSIS — M8588 Other specified disorders of bone density and structure, other site: Secondary | ICD-10-CM | POA: Diagnosis not present

## 2019-11-02 IMAGING — CR DG CHEST 2V
2 series · 2 of 2 positions shown · non-contrast
Comparison: None.

CLINICAL DATA: Dizziness and palpitations.

EXAM:
CHEST  2 VIEW

[chest lat]
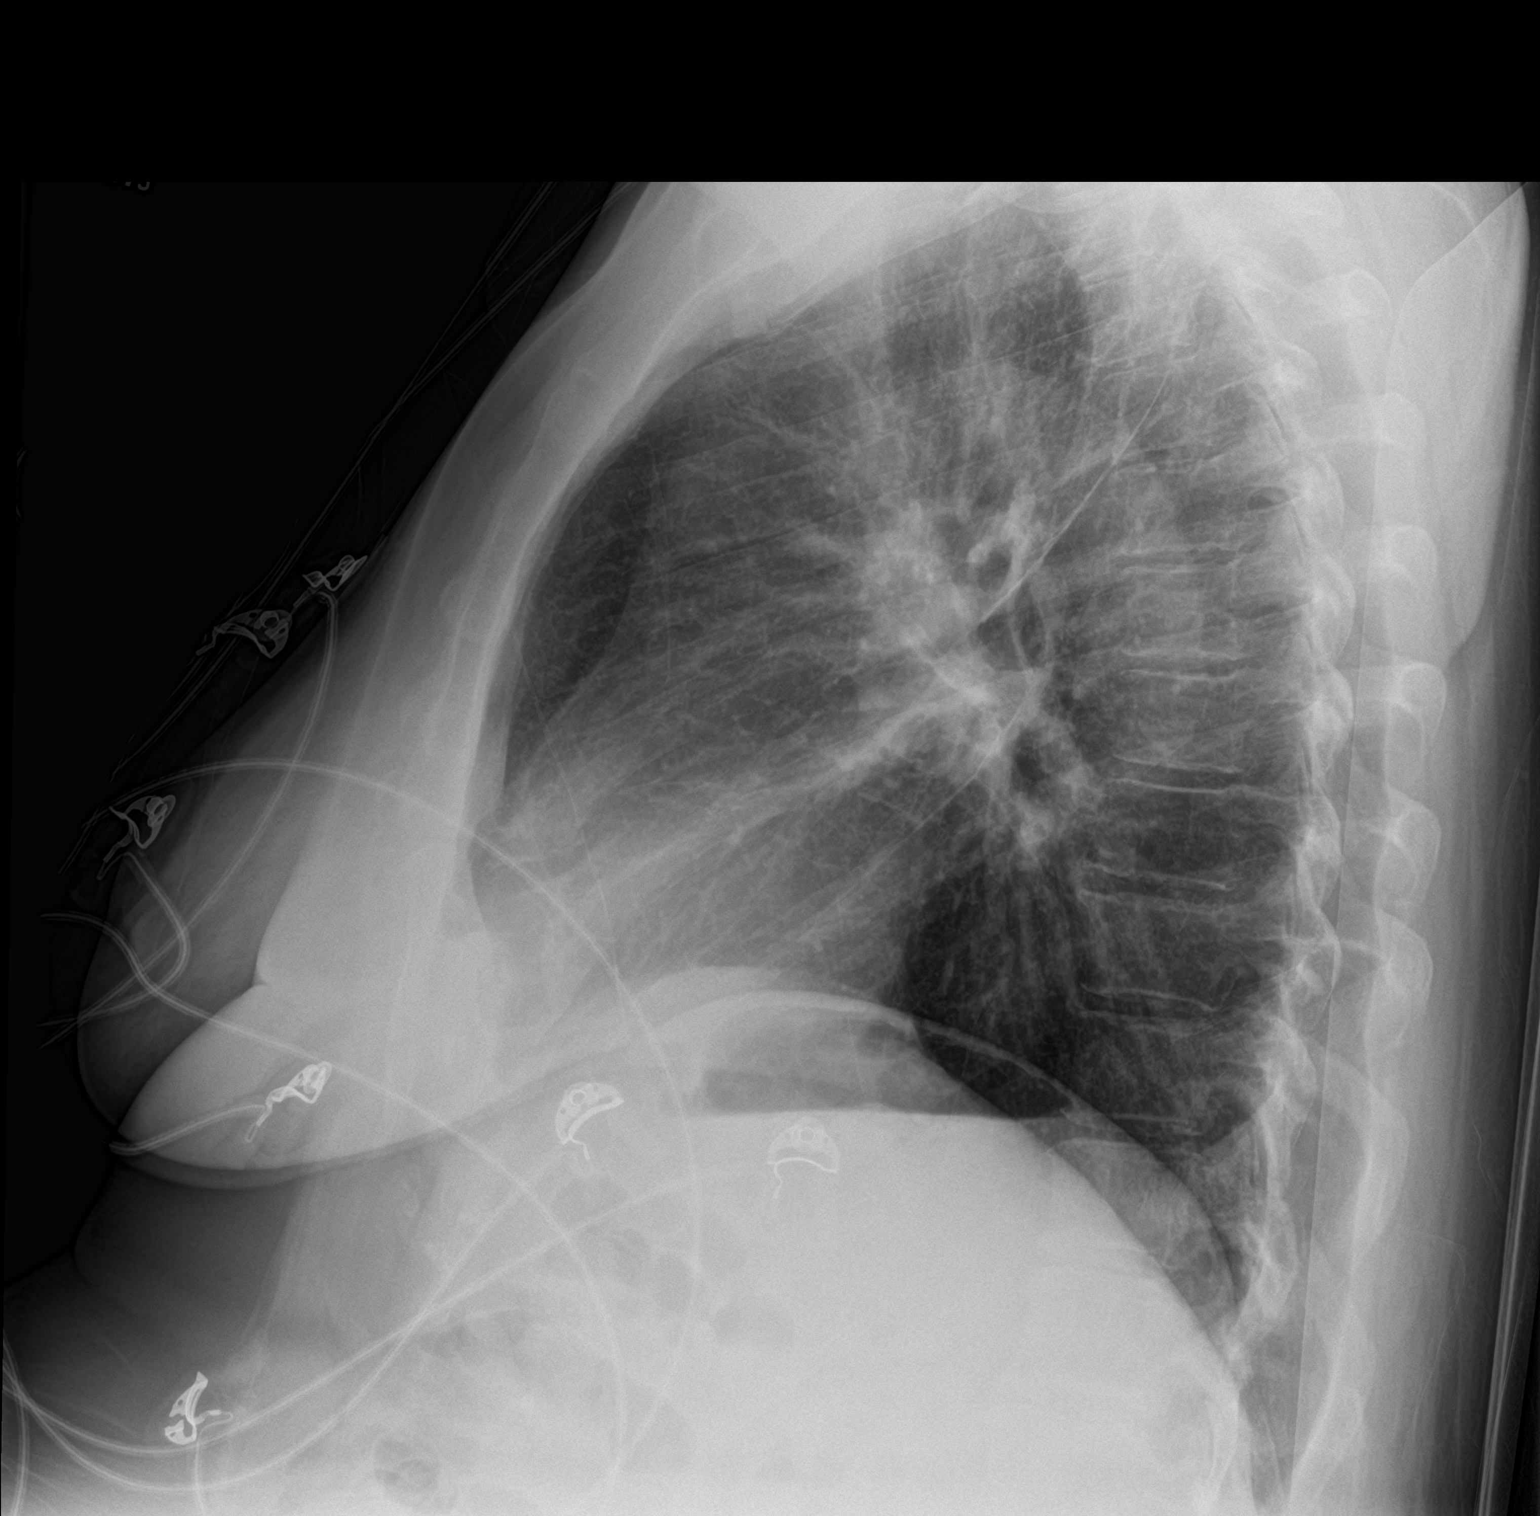

[chest ap]
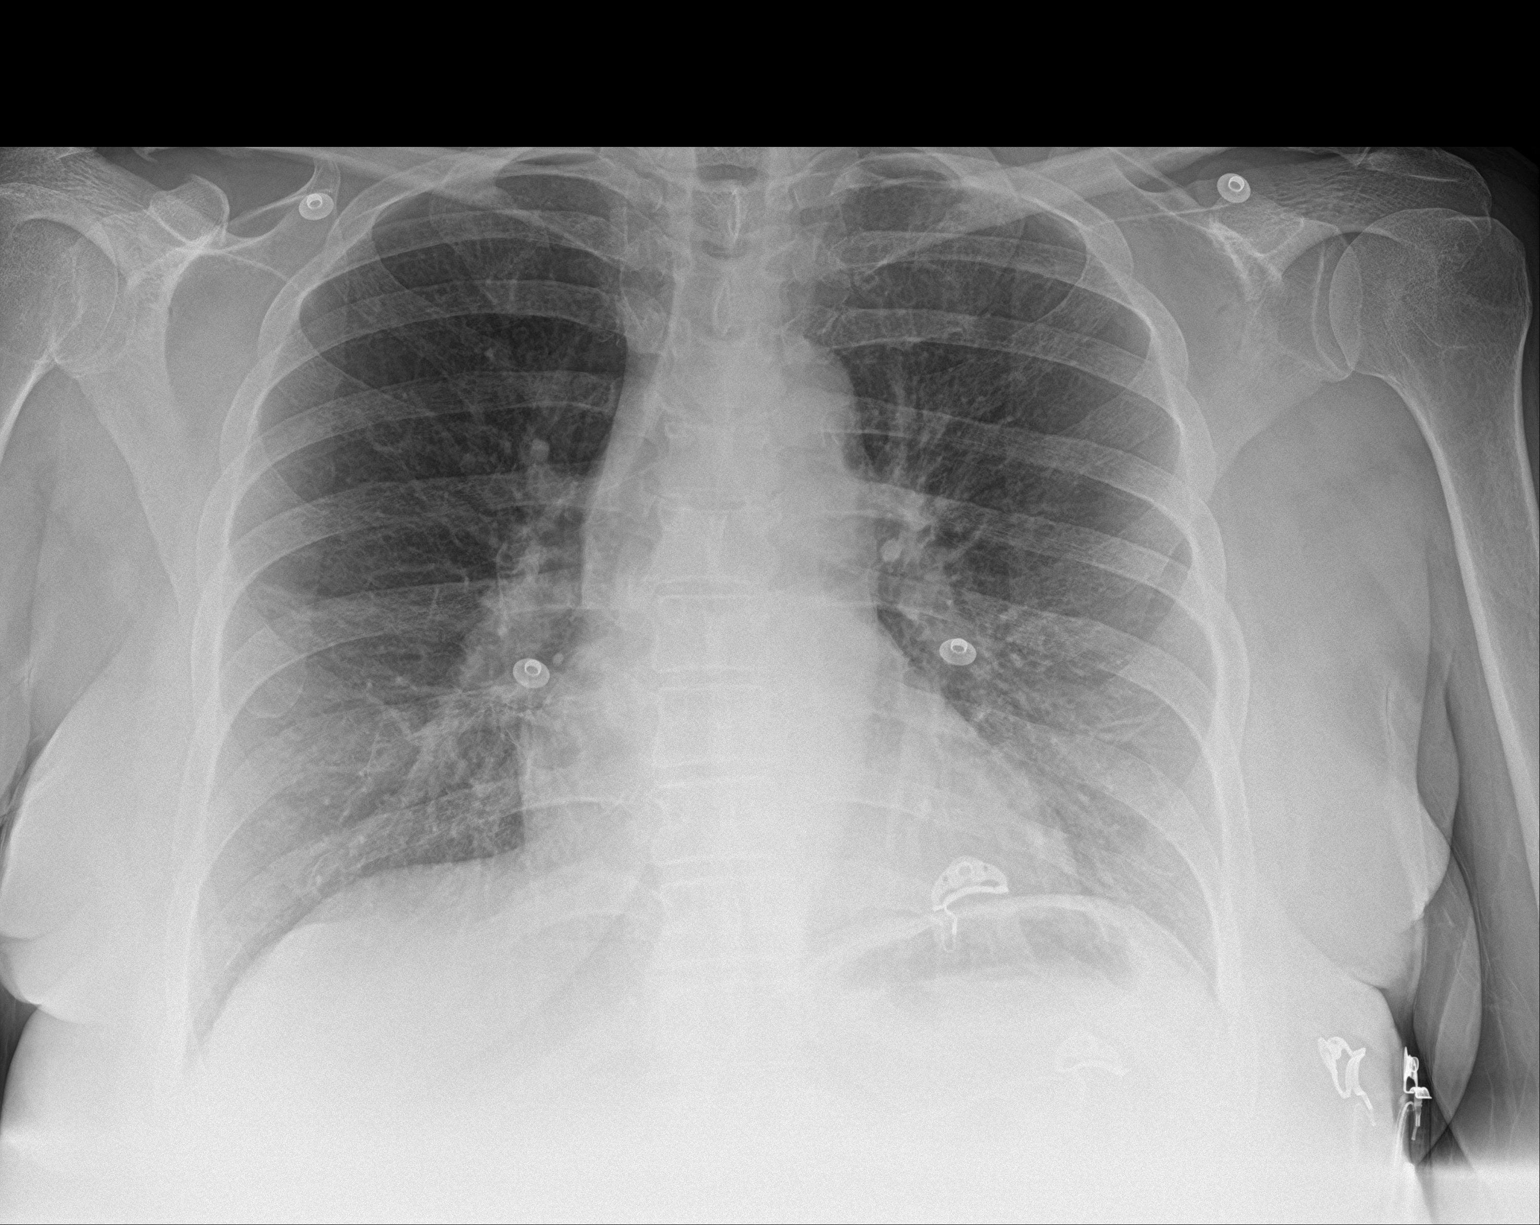

[2 of 2 positions shown; findings below may reference images not displayed]

FINDINGS: The heart size is within normal limits. Possible enlargement of the
ascending thoracic aorta, best seen on the lateral view. Normal
pulmonary vascularity. No focal consolidation, pleural effusion, or
pneumothorax. No acute osseous abnormality.
IMPRESSION: Possible enlargement of the ascending thoracic aorta, best seen on
the lateral view. Recommend CTA chest for further evaluation.

## 2020-02-08 ENCOUNTER — Other Ambulatory Visit: Payer: Self-pay

## 2020-02-08 ENCOUNTER — Ambulatory Visit
Admission: EM | Admit: 2020-02-08 | Discharge: 2020-02-08 | Disposition: A | Discharge status: Home / Self Care | Payer: Medicare Other | Attending: Emergency Medicine | Admitting: Emergency Medicine

## 2020-02-08 ENCOUNTER — Encounter: Payer: Self-pay | Admitting: Emergency Medicine

## 2020-02-08 DIAGNOSIS — M25562 Pain in left knee: Secondary | ICD-10-CM | POA: Diagnosis present

## 2020-02-08 DIAGNOSIS — M7122 Synovial cyst of popliteal space [Baker], left knee: Secondary | ICD-10-CM | POA: Insufficient documentation

## 2020-02-08 LAB — BASIC METABOLIC PANEL
Anion gap: 7 (ref 5–15)
BUN: 16 mg/dL (ref 6–20)
CO2: 26 mmol/L (ref 22–32)
Calcium: 8.9 mg/dL (ref 8.9–10.3)
Chloride: 107 mmol/L (ref 98–111)
Creatinine, Ser: 0.99 mg/dL (ref 0.44–1.00)
GFR calc Af Amer: >60 mL/min (ref >60)
GFR calc non Af Amer: >60 mL/min (ref >60)
Glucose, Bld: 100 mg/dL — ABNORMAL HIGH (ref 70–99)
Potassium: 3.7 mmol/L (ref 3.5–5.1)
Sodium: 140 mmol/L (ref 135–145)

## 2020-02-08 LAB — FIBRIN DERIVATIVES D-DIMER (ARMC ONLY): Fibrin derivatives D-dimer (ARMC): 265.91 ng/mL (FEU) (ref 0.00–499.00)

## 2020-02-08 MED ORDER — IBUPROFEN 600 MG PO TABS: 600.0000 mg | ORAL_TABLET | Freq: Four times a day (QID) | ORAL | 0 refills | Status: DC | PRN

## 2020-02-08 NOTE — Discharge Instructions (Addendum)
Your D-dimer is negative.  I strongly suspect that this is a Bakers cyst other than a blood clot in your leg.  Ice your knee for 20 minutes at a time, take 600 mg of ibuprofen combined with 1000 mg of Tylenol 3-4 times a day as needed for pain, continue to use a cane.  Rest.  Follow-up with orthopedics in a week.

## 2020-02-08 NOTE — ED Triage Notes (Signed)
Pt c/o left knee pain. She states she started walking for exercise and started when she started walking on an incline. Pt states the back of the knee is worse. She can walk on it but gait is off and she is using a walking stick. She has tried topical pain relief without relief.

## 2020-02-08 NOTE — ED Provider Notes (Signed)
HPI  SUBJECTIVE:  Whitney Ruiz is a 59 y.o. female who presents with 1 week of constant posterior knee pain, soreness that radiates up and down her leg.  She has started walking, states that she has increased her mileage and is walking uphill more.  She reports "puffiness" in the back.  No trauma, erythema, calf pain or swelling, fevers, body aches.  She has never had symptoms like this before.  She has tried elevation and blue emu ointment without improvement in her symptoms.  Symptoms are worse with weightbearing.  She has a past medical history of sarcoidosis on methotrexate, prednisone, rate controlled atrial fibrillation, diabetes.  No history of PE, DVT, hypercoagulability, Baker's cyst, hypertension, cancer, rheumatoid arthritis, osteoarthritis or left knee injury.  WER:[id number], Trudie Buckler, MD  Past Medical History:  Diagnosis Date  . Arthritis   . Atrial fibrillation (HCC)   . Heartburn   . Kidney stone   . Sarcoidosis     Past Surgical History:  Procedure Laterality Date  . ABDOMINAL HYSTERECTOMY    . HAND SURGERY  2009  . KNEE SURGERY Right   . PACEMAKER INSERTION    . SHOULDER SURGERY  2012    Family History  Problem Relation Age of Onset  . Healthy Mother   . Cancer Father        lung  . Kidney cancer Neg Hx   . Bladder Cancer Neg Hx     Social History   Tobacco Use  . Smoking status: Never Smoker  . Smokeless tobacco: Current User    Types: Snuff  Vaping Use  . Vaping Use: Never used  Substance Use Topics  . Alcohol use: Not Currently  . Drug use: No    No current facility-administered medications for this encounter.  Current Outpatient Medications:  .  ACETAMINOPHEN PO, Take 325-500 mg by mouth daily as needed (pain). , Disp: , Rfl:  .  aspirin 81 MG EC tablet, Take by mouth., Disp: , Rfl:  .  folic acid (FOLVITE) 1 MG tablet, Take by mouth., Disp: , Rfl:  .  lovastatin (MEVACOR) 10 MG tablet, SMARTSIG:1 Tablet(s) By Mouth Every Evening, Disp: , Rfl:   .  metFORMIN (GLUCOPHAGE-XR) 500 MG 24 hr tablet, Take by mouth., Disp: , Rfl:  .  methotrexate (RHEUMATREX) 2.5 MG tablet, Take 15 mg by mouth once a week., Disp: , Rfl:  .  predniSONE (DELTASONE) 5 MG tablet, Take by mouth., Disp: , Rfl:  .  sotalol (BETAPACE) 80 MG tablet, Take 80 mg by mouth 2 (two) times daily., Disp: , Rfl:  .  diltiazem (CARDIZEM) 30 MG tablet, Take 30 mg by mouth every 6 (six) hours as needed (for palpitations woth heart rate over 100 bpm.). , Disp: , Rfl:  .  ibuprofen (ADVIL) 600 MG tablet, Take 1 tablet (600 mg total) by mouth every 6 (six) hours as needed., Disp: 30 tablet, Rfl: 0  Allergies  Allergen Reactions  . Flagyl [Metronidazole] Nausea And Vomiting     ROS  As noted in HPI.   Physical Exam  BP 124/84 (BP Location: Right Arm)   Pulse 62   Temp 98 F (36.7 C) (Oral)   Resp 18   Ht 5\' 9"  (1.753 m)   Wt 94.3 kg   SpO2 99%   BMI 30.70 kg/m    Constitutional: Well developed, well nourished, no acute distress Eyes:  EOMI, conjunctiva normal bilaterally HENT: Normocephalic, atraumatic,mucus membranes moist Respiratory: Normal inspiratory effort Cardiovascular: Normal rate  GI: nondistended skin: No rash, skin intact Musculoskeletal: Very slight left posterior knee swelling.Marland Kitchen  Positive tenderness popliteal region.  No calf tenderness. No palpable cord.  Calves symmetric, no edema.  No increased temperature, erythema, crepitus of the left knee.  No tenderness over the posterior meniscal horn.Knee ROM baseline for Pt, Flexion  intact  Patella NT, Patellar apprehension test negative, Patellar tendon NT, Medial joint NT, Lateral joint NT,  Varus MCL stress testing stable, Valgus LCL stress testing stable, McMurray's testing normal, Lachman's negative. Distal NVI with intact baseline sensation / motor / pulse distal to knee.  Neurologic: Alert & oriented x 3, no focal neuro deficits Psychiatric: Speech and behavior appropriate   ED  Course   Medications - No data to display  Orders Placed This Encounter  Procedures  . Basic metabolic panel    Standing Status:   Standing    Number of Occurrences:   1  . Fibrin derivatives D-Dimer    Standing Status:   Standing    Number of Occurrences:   1    No results found for this or any previous visit (from the past 24 hour(s)). No results found.  ED Clinical Impression  1. Synovial cyst of left popliteal space   2. Acute pain of left knee      ED Assessment/Plan  Suspect Baker's cyst.  However cannot rule out DVT.  Will get D-dimer, BMP.  Plan to get ultrasound if D-dimer is positive.  No evidence of gout, septic joint.  Seriously doubt fracture, imaging was deferred.  D-dimer negative.  BMP normal.  Given low suspicion for DVT combined with negative D-dimer, do not think that we need to get an ultrasound.  Will send home with Tylenol/ibuprofen, ice, follow-up with orthopedics.  This may need to be drained.  Discussed labs, MDM, treatment plan, and plan for follow-up with patient. Discussed sn/sx that should prompt return to the ED. patient agrees with plan.   Meds ordered this encounter  Medications  . ibuprofen (ADVIL) 600 MG tablet    Sig: Take 1 tablet (600 mg total) by mouth every 6 (six) hours as needed.    Dispense:  30 tablet    Refill:  0    *This clinic note was created using Scientist, clinical (histocompatibility and immunogenetics). Therefore, there may be occasional mistakes despite careful proofreading.   ?    Domenick Gong, MD 02/11/20 [phone]

## 2020-07-20 IMAGING — CR DG LUMBAR SPINE 2-3V
3 series · 3 of 3 positions shown · non-contrast
Comparison: CT Abdomen and Pelvis 9999.

CLINICAL DATA: 57-year-old female with low back pain on the right
for 2-3 months with no known injury.

EXAM:
LUMBAR SPINE - 2-3 VIEW

[l-spine ap]
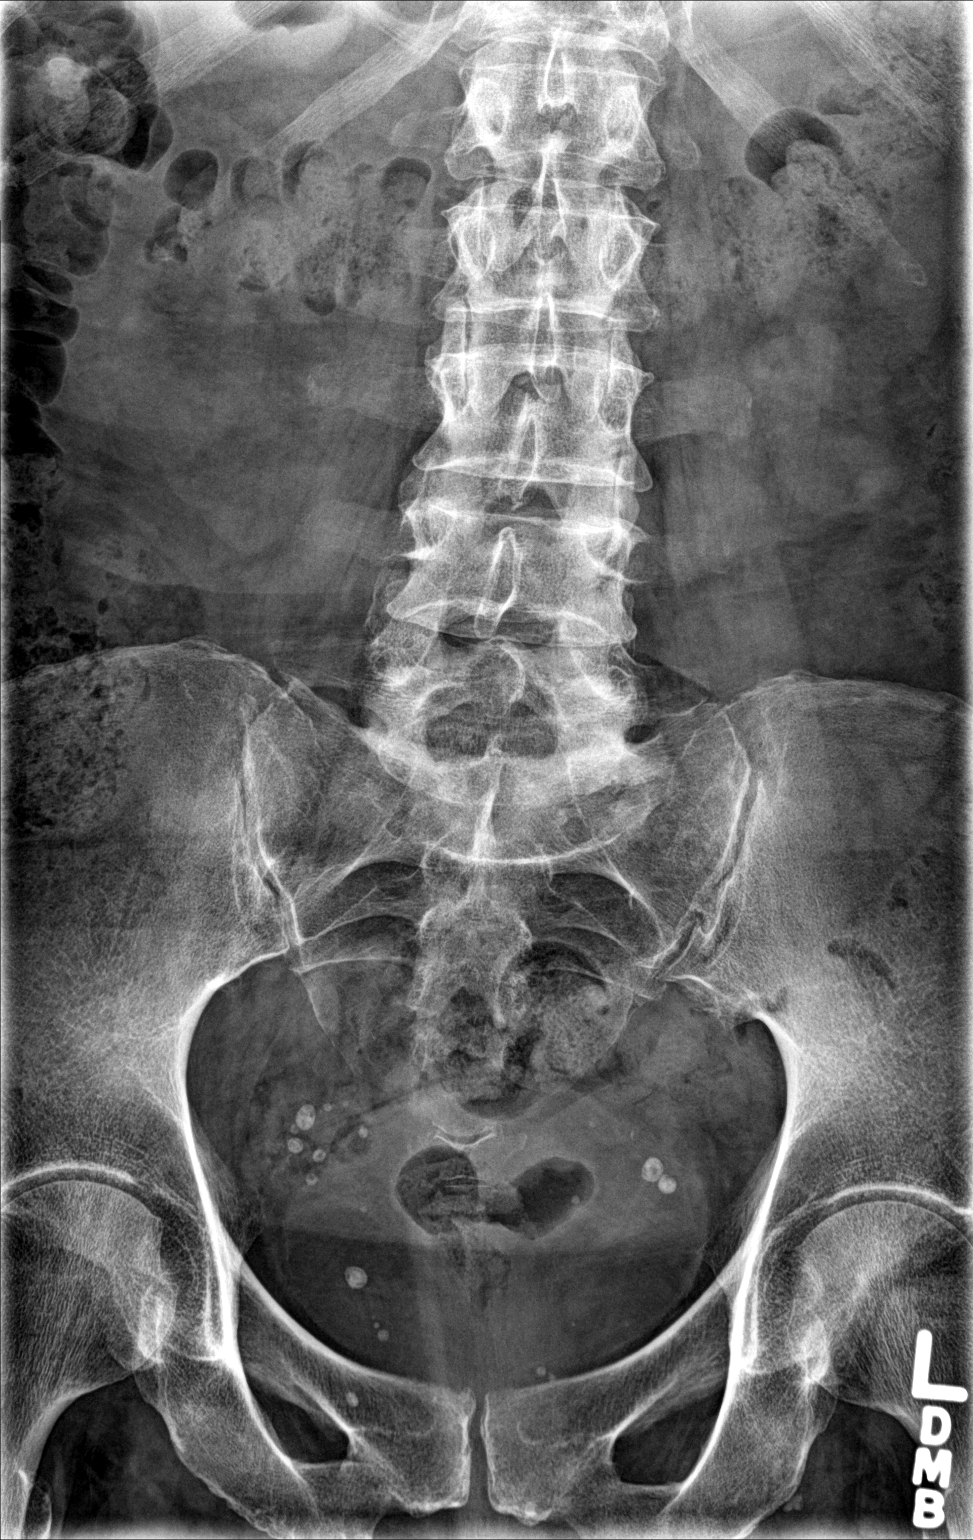

[l-spine lat]
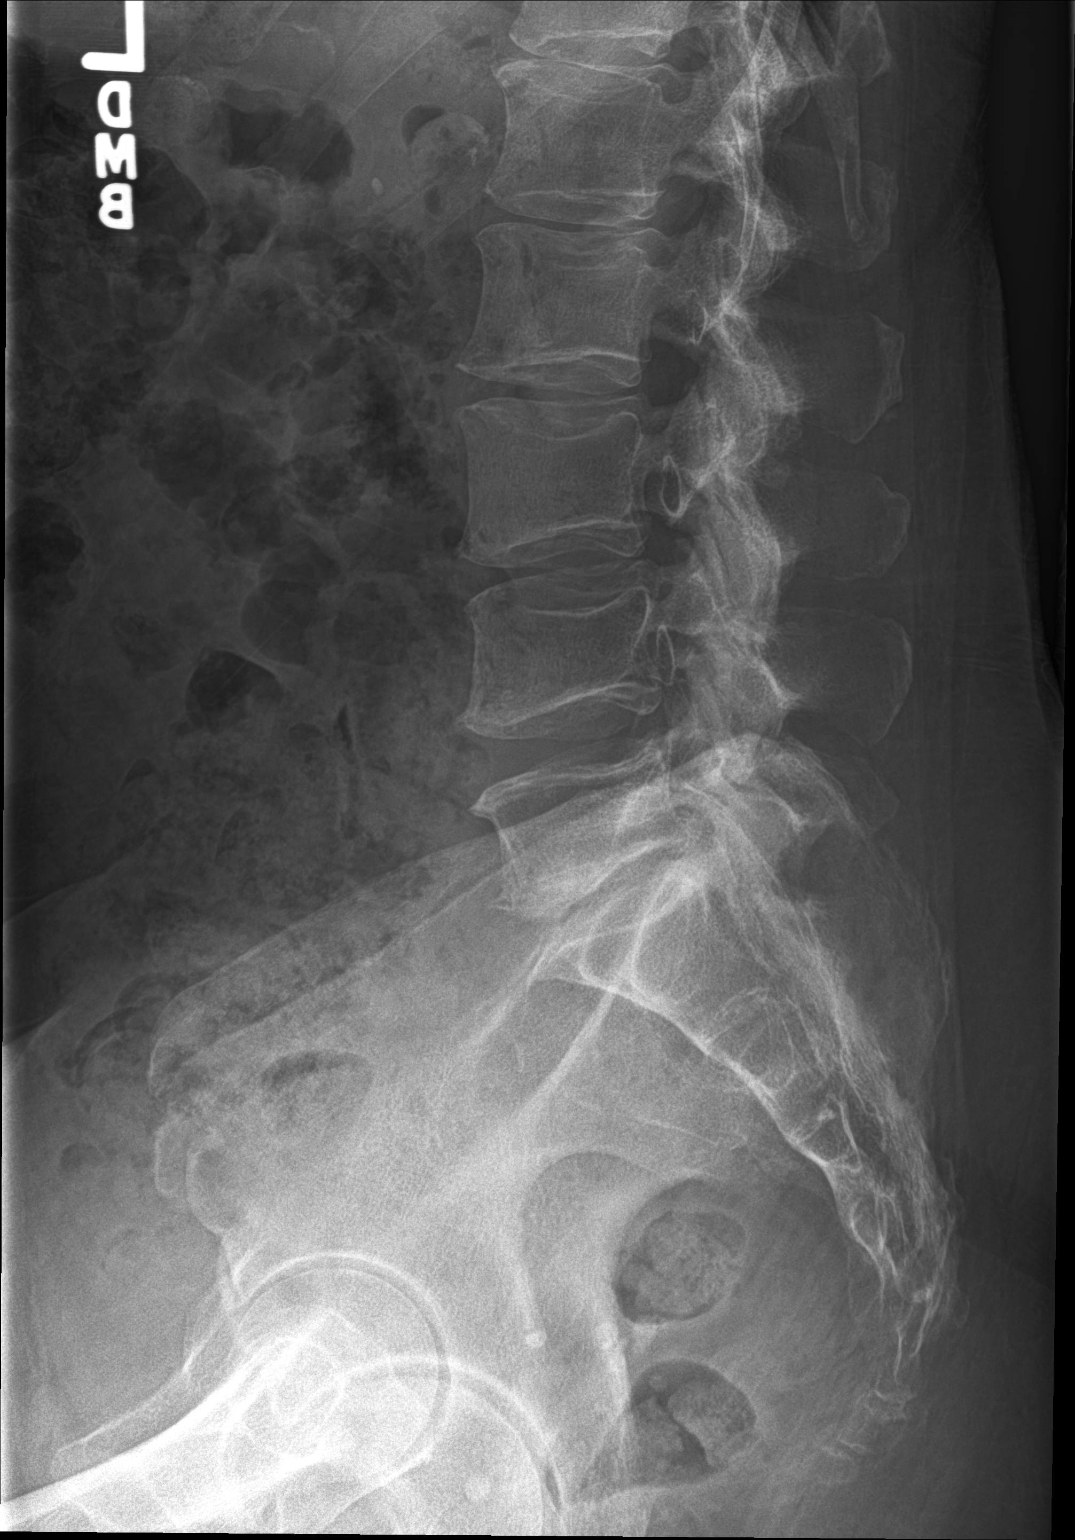

[l-spine spot]
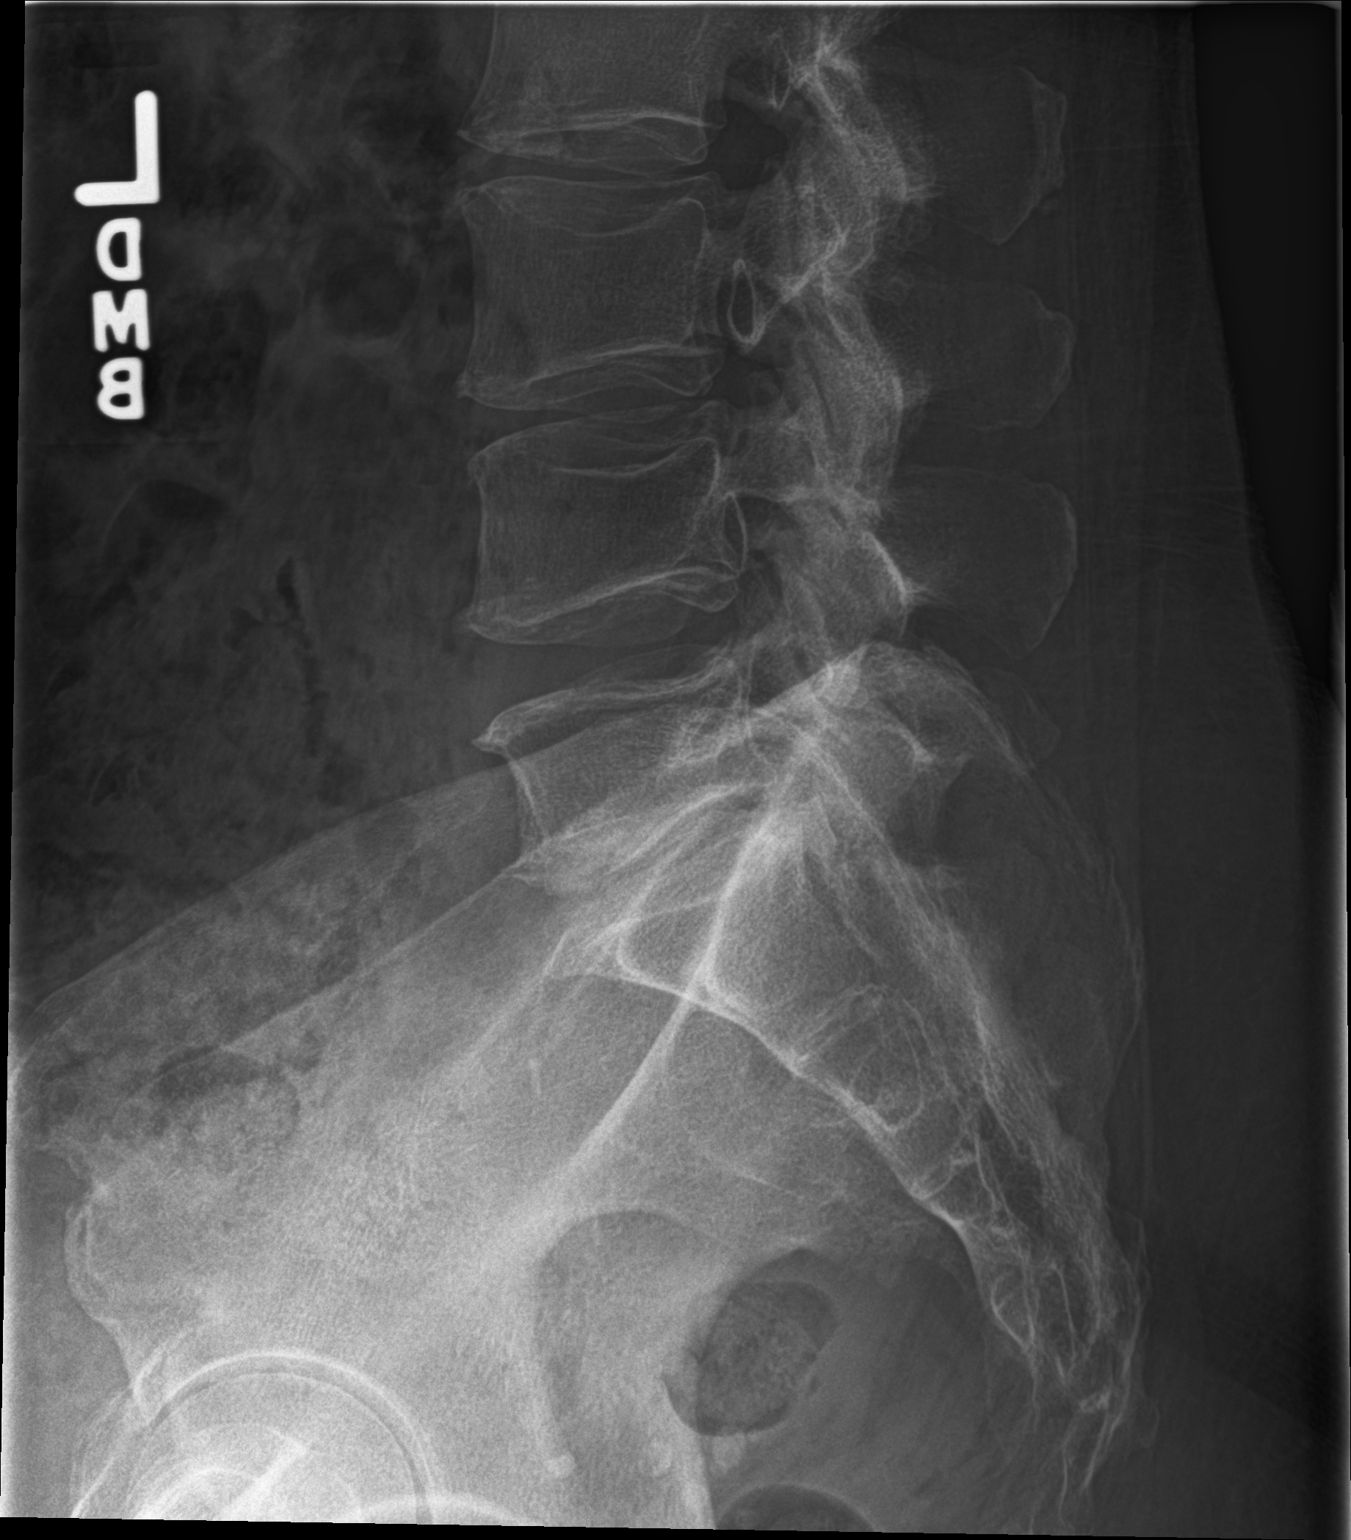

[3 of 3 positions shown; findings below may reference images not displayed]

FINDINGS: Normal lumbar segmentation. Widespread lumbar vertebral endplate
concavity but stable vertebral height and alignment since 2522.
Chronic disc space loss and vacuum disc at L5-S1. Osteopenia for
age. Mild to moderate bilateral lumbar facet hypertrophy. Sacral ala
and SI joints appear normal. No acute osseous abnormality
identified. Incidental pelvic phleboliths
IMPRESSION: 1. No acute osseous abnormality identified in the lumbar spine.
2. Osteopenia.  Chronic L5-S1 disc degeneration.

## 2021-11-15 ENCOUNTER — Ambulatory Visit
Admission: EM | Admit: 2021-11-15 | Discharge: 2021-11-15 | Disposition: A | Discharge status: Home / Self Care | Payer: Medicare Other | Attending: Emergency Medicine | Admitting: Emergency Medicine

## 2021-11-15 ENCOUNTER — Encounter: Payer: Self-pay | Admitting: Emergency Medicine

## 2021-11-15 ENCOUNTER — Other Ambulatory Visit: Payer: Self-pay

## 2021-11-15 DIAGNOSIS — R1084 Generalized abdominal pain: Secondary | ICD-10-CM | POA: Insufficient documentation

## 2021-11-15 LAB — CBC WITH DIFFERENTIAL/PLATELET
Abs Immature Granulocytes: 0.03 10*3/uL (ref 0.00–0.07)
Basophils Absolute: 0.1 10*3/uL (ref 0.0–0.1)
Basophils Relative: 1 %
Eosinophils Absolute: 0.1 10*3/uL (ref 0.0–0.5)
Eosinophils Relative: 1 %
HCT: 37.9 % (ref 36.0–46.0)
Hemoglobin: 12.2 g/dL (ref 12.0–15.0)
Immature Granulocytes: 1 %
Lymphocytes Relative: 9 %
Lymphs Abs: 0.6 10*3/uL — ABNORMAL LOW (ref 0.7–4.0)
MCH: 27.7 pg (ref 26.0–34.0)
MCHC: 32.2 g/dL (ref 30.0–36.0)
MCV: 85.9 fL (ref 80.0–100.0)
Monocytes Absolute: 0.5 10*3/uL (ref 0.1–1.0)
Monocytes Relative: 7 %
Neutro Abs: 5.4 10*3/uL (ref 1.7–7.7)
Neutrophils Relative %: 81 %
Platelets: 250 10*3/uL (ref 150–400)
RBC: 4.41 MIL/uL (ref 3.87–5.11)
RDW: 14.8 % (ref 11.5–15.5)
WBC: 6.6 10*3/uL (ref 4.0–10.5)
nRBC: 0 % (ref 0.0–0.2)

## 2021-11-15 LAB — COMPREHENSIVE METABOLIC PANEL
ALT: 15 U/L (ref 0–44)
AST: 18 U/L (ref 15–41)
Albumin: 3.9 g/dL (ref 3.5–5.0)
Alkaline Phosphatase: 89 U/L (ref 38–126)
Anion gap: 6 (ref 5–15)
BUN: 8 mg/dL (ref 6–20)
CO2: 26 mmol/L (ref 22–32)
Calcium: 8.5 mg/dL — ABNORMAL LOW (ref 8.9–10.3)
Chloride: 105 mmol/L (ref 98–111)
Creatinine, Ser: 0.85 mg/dL (ref 0.44–1.00)
GFR, Estimated: >60 mL/min (ref >60)
Glucose, Bld: 106 mg/dL — ABNORMAL HIGH (ref 70–99)
Potassium: 3.7 mmol/L (ref 3.5–5.1)
Sodium: 137 mmol/L (ref 135–145)
Total Bilirubin: 0.6 mg/dL (ref 0.3–1.2)
Total Protein: 7 g/dL (ref 6.5–8.1)

## 2021-11-15 LAB — LIPASE, BLOOD: Lipase: 30 U/L (ref 11–51)

## 2021-11-15 LAB — URINALYSIS, MICROSCOPIC (REFLEX): WBC, UA: NONE SEEN WBC/hpf (ref 0–5)

## 2021-11-15 LAB — URINALYSIS, ROUTINE W REFLEX MICROSCOPIC
Bilirubin Urine: NEGATIVE
Glucose, UA: NEGATIVE mg/dL
Ketones, ur: NEGATIVE mg/dL
Leukocytes,Ua: NEGATIVE
Nitrite: NEGATIVE
Protein, ur: NEGATIVE mg/dL
Specific Gravity, Urine: <1.005 — ABNORMAL LOW (ref 1.005–1.030)
pH: 6 (ref 5.0–8.0)

## 2021-11-15 NOTE — ED Triage Notes (Signed)
Abdominal pain, intermittently for 2 weeks, generalized abdominal pain.  Reports a UTI found with physical by provider a few months ago.    Reports loose stools for 2 weeks, every time she eats, "it runs right through me".  Reports foul odor to stool, different than usual.  Reports foul odor to urine.

## 2021-11-15 NOTE — ED Provider Notes (Signed)
HPI  SUBJECTIVE:  Whitney Ruiz is a 61 y.o. female who presents with 2 weeks of intermittent, diffuse, seconds long abdominal pain described as burning and pressure.  She reports odorous urine.  No nausea, vomiting, fevers, abdominal distention, anorexia, dysuria, urgency, frequency, cloudy urine, hematuria, back pain.  No vaginal bleeding, odor, discharge.  She has not been sexually active in 8 years.  No melena, hematochezia.  She also reports loose, watery, nonbloody stools that alternates between solid and diarrhea.  She is having 1 episode of this a day, after she eats.  She states that food is "running through me".  She has changed her diet, is eating more salads recently.  No change in her medications, recent travel, overall or undercooked foods, questionable leftovers, sick contacts.  No recent antibiotics.  She has not tried anything for her symptoms.  Symptoms are better with stooling.  No aggravating factors.  She states that the car ride over here was not painful.  The pain is not associated with movement or walking.  She has a past medical history of sarcoidosis and is on methotrexate, atrial fibrillation with a pacemaker and UTI.  She is status post abdominal hysterectomy.  She is not on any anticoagulants, antiplatelets, no history of diverticulitis, mesenteric ischemia, other abdominal surgeries.  She does not drink.  Her colonoscopy 4 years ago was normal.  PCP: [facility].   Past Medical History:  Diagnosis Date   Arthritis    Atrial fibrillation (HCC)    Heartburn    Kidney stone    Sarcoidosis     Past Surgical History:  Procedure Laterality Date   ABDOMINAL HYSTERECTOMY     HAND SURGERY  2009   KNEE SURGERY Right    PACEMAKER INSERTION     SHOULDER SURGERY  2012    Family History  Problem Relation Age of Onset   Healthy Mother    Cancer Father        lung   Kidney cancer Neg Hx    Bladder Cancer Neg Hx     Social History   Tobacco Use   Smoking  status: Never   Smokeless tobacco: Current    Types: Snuff  Vaping Use   Vaping Use: Never used  Substance Use Topics   Alcohol use: Not Currently   Drug use: No    No current facility-administered medications for this encounter.  Current Outpatient Medications:    ACETAMINOPHEN PO, Take 325-500 mg by mouth daily as needed (pain). , Disp: , Rfl:    aspirin 81 MG EC tablet, Take by mouth., Disp: , Rfl:    diltiazem (CARDIZEM) 30 MG tablet, Take 30 mg by mouth every 6 (six) hours as needed (for palpitations woth heart rate over 100 bpm.). , Disp: , Rfl:    lovastatin (MEVACOR) 10 MG tablet, SMARTSIG:1 Tablet(s) By Mouth Every Evening, Disp: , Rfl:    metFORMIN (GLUCOPHAGE-XR) 500 MG 24 hr tablet, Take by mouth., Disp: , Rfl:    methotrexate (RHEUMATREX) 2.5 MG tablet, Take 15 mg by mouth once a week., Disp: , Rfl:    sotalol (BETAPACE) 80 MG tablet, Take 80 mg by mouth 2 (two) times daily., Disp: , Rfl:   Allergies  Allergen Reactions   Flagyl [Metronidazole] Nausea And Vomiting     ROS  As noted in HPI.   Physical Exam  BP 135/78 (BP Location: Right Arm) Comment (BP Location): large cuff  Pulse 60   Temp 98.2 F (36.8 C) (  Oral)   Resp 20   SpO2 98%   Constitutional: Well developed, well nourished, no acute distress Eyes: PERRL, EOMI, conjunctiva normal bilaterally HENT: Normocephalic, atraumatic,mucus membranes moist Respiratory: Normal inspiratory effort Cardiovascular: Normal rate and rhythm, no murmurs, no gallops, no rubs GI: Soft, nondistended, normal bowel sounds, positive mild right upper quadrant tenderness.  Negative Murphy, negative McBurney.  No rebound, no guarding Back: no CVAT skin: No rash, skin intact Musculoskeletal: No edema Neurologic: Alert & oriented x 3, CN III-XII grossly intact, no motor deficits, sensation grossly intact Psychiatric: Speech and behavior appropriate   ED Course   Medications - No data to display  Orders Placed This  Encounter  Procedures   Urine Culture    Standing Status:   Standing    Number of Occurrences:   1    Order Specific Question:   Indication    Answer:   Suprapubic pain   Urinalysis, Routine w reflex microscopic Urine, Clean Catch    Standing Status:   Standing    Number of Occurrences:   1   Urinalysis, Microscopic (reflex)    Standing Status:   Standing    Number of Occurrences:   1   CBC with Differential    Standing Status:   Standing    Number of Occurrences:   1   Comprehensive metabolic panel    Standing Status:   Standing    Number of Occurrences:   1   Lipase, blood    Standing Status:   Standing    Number of Occurrences:   1   Results for orders placed or performed during the hospital encounter of 11/15/21 (from the past 24 hour(s))  Urinalysis, Routine w reflex microscopic Urine, Clean Catch     Status: Abnormal   Collection Time: 11/15/21  9:29 AM  Result Value Ref Range   Color, Urine YELLOW YELLOW   APPearance CLEAR CLEAR   Specific Gravity, Urine <1.005 (L) 1.005 - 1.030   pH 6.0 5.0 - 8.0   Glucose, UA NEGATIVE NEGATIVE mg/dL   Hgb urine dipstick TRACE (A) NEGATIVE   Bilirubin Urine NEGATIVE NEGATIVE   Ketones, ur NEGATIVE NEGATIVE mg/dL   Protein, ur NEGATIVE NEGATIVE mg/dL   Nitrite NEGATIVE NEGATIVE   Leukocytes,Ua NEGATIVE NEGATIVE  Urinalysis, Microscopic (reflex)     Status: Abnormal   Collection Time: 11/15/21  9:29 AM  Result Value Ref Range   RBC / HPF 0-5 0 - 5 RBC/hpf   WBC, UA NONE SEEN 0 - 5 WBC/hpf   Bacteria, UA RARE (A) NONE SEEN   Squamous Epithelial / LPF 0-5 0 - 5  CBC with Differential     Status: Abnormal   Collection Time: 11/15/21 10:22 AM  Result Value Ref Range   WBC 6.6 4.0 - 10.5 K/uL   RBC 4.41 3.87 - 5.11 MIL/uL   Hemoglobin 12.2 12.0 - 15.0 g/dL   HCT 16.137.9 09.636.0 - 04.546.0 %   MCV 85.9 80.0 - 100.0 fL   MCH 27.7 26.0 - 34.0 pg   MCHC 32.2 30.0 - 36.0 g/dL   RDW 40.914.8 81.111.5 - 91.415.5 %   Platelets 250 150 - 400 K/uL   nRBC  0.0 0.0 - 0.2 %   Neutrophils Relative % 81 %   Neutro Abs 5.4 1.7 - 7.7 K/uL   Lymphocytes Relative 9 %   Lymphs Abs 0.6 (L) 0.7 - 4.0 K/uL   Monocytes Relative 7 %   Monocytes Absolute 0.5 0.1 -  1.0 K/uL   Eosinophils Relative 1 %   Eosinophils Absolute 0.1 0.0 - 0.5 K/uL   Basophils Relative 1 %   Basophils Absolute 0.1 0.0 - 0.1 K/uL   Immature Granulocytes 1 %   Abs Immature Granulocytes 0.03 0.00 - 0.07 K/uL  Comprehensive metabolic panel     Status: Abnormal   Collection Time: 11/15/21 10:22 AM  Result Value Ref Range   Sodium 137 135 - 145 mmol/L   Potassium 3.7 3.5 - 5.1 mmol/L   Chloride 105 98 - 111 mmol/L   CO2 26 22 - 32 mmol/L   Glucose, Bld 106 (H) 70 - 99 mg/dL   BUN 8 6 - 20 mg/dL   Creatinine, Ser 6.57 0.44 - 1.00 mg/dL   Calcium 8.5 (L) 8.9 - 10.3 mg/dL   Total Protein 7.0 6.5 - 8.1 g/dL   Albumin 3.9 3.5 - 5.0 g/dL   AST 18 15 - 41 U/L   ALT 15 0 - 44 U/L   Alkaline Phosphatase 89 38 - 126 U/L   Total Bilirubin 0.6 0.3 - 1.2 mg/dL   GFR, Estimated >90 >38 mL/min   Anion gap 6 5 - 15  Lipase, blood     Status: None   Collection Time: 11/15/21 10:22 AM  Result Value Ref Range   Lipase 30 11 - 51 U/L   No results found.  ED Clinical Impression  1. Generalized abdominal pain      ED Assessment/Plan  Pt abd exam is benign, no peritoneal signs. No evidence of surgical abd. Doubt SBO, mesenteric ischemia, appendicitis, hepatitis, cholecystitis, pancreatitis, or perforated viscus. No evidence to support or suggest GYN pathology such as ovarian torsion or infection.   Urine negative for UTI.  She has trace hematuria, rare bacteria.  Will send off for culture prior to initiating antibiotic treatment.  She has had symptoms for 2 weeks, there is no evidence of pyelonephritis, so we have decided to wait for treatment of a possible UTI.  Will check CBC, CMP, lipase.  She does have some right upper quadrant tenderness.  She has a negative Murphy sign, no guarding,  rebound, has normal vitals.  Doubt acute cholecystitis.  Will call patient if any of these are abnormal at [phone].  If significantly abnormal, she has agreed to go to the ED.  Otherwise, she will need to follow-up with her primary care provider for right upper quadrant ultrasound if her symptoms continue.  We can try some Imodium with eating for now.  Labs reviewed.  Lipase, CMP unremarkable.  Slightly low calcium, but do not think that this is clinically significant.  Her liver enzymes, bilirubin are normal.  UA with trace hematuria and rare bacteria, could be nonobstructing nephrolithiasis, but patient was well-hydrated, had pain only with deep palpation in the right upper quadrant while here.  She was given strict ER return precautions.  Discussed labs,  MDM, treatment plan, and plan for follow-up with patient Discussed sn/sx that should prompt return to the ED. patient agrees with plan.   No orders of the defined types were placed in this encounter.     *This clinic note was created using Dragon dictation software. Therefore, there may be occasional mistakes despite careful proofreading. ?    Domenick Gong, MD 11/15/21 1409

## 2021-11-15 NOTE — Discharge Instructions (Addendum)
I will contact you if and only if your labs are abnormal and we need to change management.  If you do not hear from you by the end of the day, you can assume that they are normal.  I am sending your urine off for culture before starting you on antibiotics for urinary tract infection.  You do have some tenderness in the right upper quadrant, you could be having a problem with your gallbladder.  Please follow-up with your primary care provider ASAP if your symptoms persist.  You can try some Imodium before eating to see if that does not slow things down.  Go immediately to the ER for fevers above 100.4, if your pain changes, gets worse, vomiting, abdominal distention, or for any other concerns.

## 2021-11-17 LAB — URINE CULTURE

## 2021-12-18 ENCOUNTER — Other Ambulatory Visit: Payer: Self-pay | Admitting: Family Medicine

## 2021-12-18 DIAGNOSIS — Z1231 Encounter for screening mammogram for malignant neoplasm of breast: Secondary | ICD-10-CM

## 2021-12-25 ENCOUNTER — Ambulatory Visit
Admission: RE | Admit: 2021-12-25 | Discharge: 2021-12-25 | Disposition: A | Discharge status: Home / Self Care | Payer: Medicare Other | Source: Ambulatory Visit | Attending: Family Medicine | Admitting: Family Medicine

## 2021-12-25 ENCOUNTER — Inpatient Hospital Stay: Admission: RE | Admit: 2021-12-25 | Payer: Federal, State, Local not specified - PPO | Source: Ambulatory Visit

## 2021-12-25 DIAGNOSIS — Z1231 Encounter for screening mammogram for malignant neoplasm of breast: Secondary | ICD-10-CM | POA: Insufficient documentation

## 2022-03-04 ENCOUNTER — Other Ambulatory Visit: Payer: Self-pay | Admitting: Family Medicine

## 2022-03-04 DIAGNOSIS — E119 Type 2 diabetes mellitus without complications: Secondary | ICD-10-CM

## 2022-03-04 DIAGNOSIS — R1032 Left lower quadrant pain: Secondary | ICD-10-CM

## 2022-03-14 ENCOUNTER — Ambulatory Visit: Payer: Federal, State, Local not specified - PPO

## 2022-04-17 ENCOUNTER — Ambulatory Visit: Admission: RE | Admit: 2022-04-17 | Payer: Federal, State, Local not specified - PPO | Source: Ambulatory Visit

## 2022-06-21 ENCOUNTER — Ambulatory Visit
Admission: EM | Admit: 2022-06-21 | Discharge: 2022-06-21 | Disposition: A | Discharge status: Home / Self Care | Payer: Medicare Other | Attending: Family Medicine | Admitting: Family Medicine

## 2022-06-21 DIAGNOSIS — R319 Hematuria, unspecified: Secondary | ICD-10-CM | POA: Diagnosis present

## 2022-06-21 DIAGNOSIS — B3731 Acute candidiasis of vulva and vagina: Secondary | ICD-10-CM

## 2022-06-21 DIAGNOSIS — I480 Paroxysmal atrial fibrillation: Secondary | ICD-10-CM

## 2022-06-21 DIAGNOSIS — R109 Unspecified abdominal pain: Secondary | ICD-10-CM

## 2022-06-21 LAB — URINALYSIS, ROUTINE W REFLEX MICROSCOPIC
Bilirubin Urine: NEGATIVE
Glucose, UA: NEGATIVE mg/dL
Ketones, ur: NEGATIVE mg/dL
Leukocytes,Ua: NEGATIVE
Nitrite: NEGATIVE
Protein, ur: NEGATIVE mg/dL
Specific Gravity, Urine: 1.015 (ref 1.005–1.030)
pH: 6 (ref 5.0–8.0)

## 2022-06-21 LAB — URINALYSIS, MICROSCOPIC (REFLEX)

## 2022-06-21 MED ORDER — FLUCONAZOLE 150 MG PO TABS: 150.0000 mg | ORAL_TABLET | ORAL | 0 refills | Status: AC

## 2022-06-21 NOTE — ED Triage Notes (Signed)
Pt c/o dysuria & abd pain x1 wk, denies any N/V/D or hematuria.

## 2022-06-21 NOTE — Discharge Instructions (Addendum)
Patient has evidence of yeast in your urine we will give you 2 doses of Diflucan.  Take this medication every 72 hours (3 days) for 2 doses.  You had evidence of blood in your urine today.  Given that you also have abdominal pain you may actually have a kidney stone.  If your pain does not improve after your constipation cleanout or acutely worsens please go to the emergency department for a CT scan of your abdomen.  For constipation clean out, 8 capfuls of Miralax in 32 oz of fluid 1-2 times a day until you have light brown-clear stool.   For stool maintenance, take 1-2 capfuls daily for at least 2 months.   Follow up with your cardiologist, as scheduled.

## 2022-06-21 NOTE — ED Provider Notes (Signed)
[facility]    CSN: [id number] Arrival date & time: 06/21/22  0806      History   Chief Complaint Chief Complaint  Patient presents with   Dysuria   Abdominal Pain     HPI HPI Whitney Ruiz is a 62 y.o. female.    Whitney Ruiz presents for dysuria and sharp LLQ abdominal pain for the past week. Has right lower back pain.  Denies nausea, vomiting, hematuria and history of kidney stones.  Notes constipation 3 weeks ago that resolved with prune juice.  Has been no diarrhea, urinary frequency, urinary urgency, fever. Has pushed her hemorrhoids out. Passed a big flatus this morning.  Her PCP sent her antibiotics to the home instead of a colonoscopy.  Patient wondering why she was not scheduled for colonoscopy instead.  Last colonoscopy was about 10 years ago. She is not concerned for STDs as she is not currently sexually active.   Has been having fluttering in her chest.  States that she has a pacemaker.  Follows with a cardiologist but does not have an appointment until February.  Denies shortness of breath, chest discomfort or pain.  Says it does not feel like palpitations.  She does not have any reflux symptoms.  She believes it was related to her blood sugars and took an extra dose of metformin because she had skipped a couple doses.  She recently had to move in with her mom due to her mom's health problems.         Past Medical History:  Diagnosis Date   Arthritis    Atrial fibrillation (HCC)    Heartburn    Kidney stone    Sarcoidosis     There are no problems to display for this patient.   Past Surgical History:  Procedure Laterality Date   ABDOMINAL HYSTERECTOMY     HAND SURGERY  2009   KNEE SURGERY Right    PACEMAKER INSERTION     SHOULDER SURGERY  2012    OB History   No obstetric history on file.      Home Medications    Prior to Admission medications   Medication Sig Start Date End Date Taking? Authorizing Provider  ACETAMINOPHEN  PO Take 325-500 mg by mouth daily as needed (pain).    Yes [provider]  aspirin 81 MG EC tablet Take by mouth.   Yes [provider]  diltiazem (CARDIZEM) 30 MG tablet Take 30 mg by mouth every 6 (six) hours as needed (for palpitations woth heart rate over 100 bpm.).    Yes [provider]  fluconazole (DIFLUCAN) 150 MG tablet Take 1 tablet (150 mg total) by mouth every 3 (three) days for 2 doses. 06/21/22 06/25/22 Yes Ajah Vanhoose, DO  lovastatin (MEVACOR) 10 MG tablet SMARTSIG:1 Tablet(s) By Mouth Every Evening 01/06/20  Yes [provider]  metFORMIN (GLUCOPHAGE-XR) 500 MG 24 hr tablet Take by mouth. 11/12/19  Yes [provider]  methotrexate (RHEUMATREX) 2.5 MG tablet Take 15 mg by mouth once a week. 02/04/20  Yes [provider]  sotalol (BETAPACE) 80 MG tablet Take 80 mg by mouth 2 (two) times daily.   Yes [provider]    Family History Family History  Problem Relation Age of Onset   Healthy Mother    Cancer Father        lung   Kidney cancer Neg Hx    Bladder Cancer Neg Hx     Social History  Social History   Tobacco Use   Smoking status: Never   Smokeless tobacco: Current    Types: Snuff  Vaping Use   Vaping Use: Never used  Substance Use Topics   Alcohol use: Not Currently   Drug use: No     Allergies   Flagyl [metronidazole]   Review of Systems Review of Systems: :negative unless otherwise stated in HPI.      Physical Exam Triage Vital Signs ED Triage Vitals  Enc Vitals Group     BP 06/21/22 0815 126/83     Pulse Rate 06/21/22 0815 64     Resp 06/21/22 0815 16     Temp 06/21/22 0815 97.9 F (36.6 C)     Temp Source 06/21/22 0815 Oral     SpO2 06/21/22 0815 96 %     Weight 06/21/22 0814 206 lb (93.4 kg)     Height 06/21/22 0814 5\' 10"  (1.778 m)     Head Circumference --      Peak Flow --      Pain Score 06/21/22 0814 6     Pain Loc --      Pain Edu? --      Excl. in [place]? --    No  data found.  Updated Vital Signs BP 126/83 (BP Location: Left Arm)   Pulse 64   Temp 97.9 F (36.6 C) (Oral)   Resp 16   Ht 5\' 10"  (1.778 m)   Wt 93.4 kg   SpO2 96%   BMI 29.56 kg/m   Visual Acuity Right Eye Distance:   Left Eye Distance:   Bilateral Distance:    Right Eye Near:   Left Eye Near:    Bilateral Near:     Physical Exam GEN: non-toxic appearing female in no acute distress  CVS: well perfused, irregularly irregular rhythm, normal rate  RESP: speaking in full sentences without pause, CTA bilaterally   ABD: soft, non-tender in all quadrants, non-distended, no palpable masses, no CVA tenderness bilaterally, active bowel sounds GU: deferred, patient performed self swab  SKIN: warm, dry    UC Treatments / Results  Labs (all labs ordered are listed, but only abnormal results are displayed) Labs Reviewed  URINALYSIS, ROUTINE W REFLEX MICROSCOPIC - Abnormal; Notable for the following components:      Result Value   Hgb urine dipstick SMALL (*)    All other components within normal limits  URINALYSIS, MICROSCOPIC (REFLEX) - Abnormal; Notable for the following components:   Bacteria, UA FEW (*)    All other components within normal limits    EKG   Radiology No results found.  Procedures Procedures (including critical care time)  Medications Ordered in UC Medications - No data to display  Initial Impression / Assessment and Plan / UC Course  I have reviewed the triage vital signs and the nursing notes.  Pertinent labs & imaging results that were available during my care of the patient were reviewed by me and considered in my medical decision making (see chart for details).      Patient is a 62 y.o.Marland Kitchen female  who presents for dysuria and abdominal pain.  Overall patient is well-appearing and afebrile.  Vital signs stable.  Abdominal exam unremarkable.   Pt with constipation and LLQ abdominal pain.  Provided and reviewed constipation clean out and  maintenance plan with patient. Discussed eating small meals frequently and increased fluid intake, as well as gradual increase in fiber intake with dried fruits, vegetables with  skins, beans, whole grains and cereal. Goal 30g of fiber per day. Encouraged drinking hot beverages and prune juice; add probiotic-containing foods like pasteurized yogurt and kefir; encourage increased physical activity.    She reports right lower back and left lower quadrant abdominal pain.  UA not consistent with acute cystitis.   Hematuria supported on microscopy.  There was some yeast seen on her microscopy.  Diflucan sent to the pharmacy.  Unfortunately, we do not have CT available here.  On chart review, she had a CT renal completed on 01/15/2017 but this did not show a kidney stone.  Today, advised CT Renal to rule out kidney stone.  She will go to ED, if abdominal pain not relieved with constipation clean out.   She has paroxsymal AFIB and is currently in an irregularly irregular rhythm on exam. She takes ASA daily and Sotalol BID. No blood thinners.  She is currently rate controlled.  HR 64.  Advised to keep upcoming appointment with cardiologist.    Discussed MDM, treatment plan and plan for follow-up with patient who agrees with plan.        Final Clinical Impressions(s) / UC Diagnoses   Final diagnoses:  Yeast vaginitis  Hematuria, unspecified type  Abdominal pain, unspecified abdominal location  Paroxysmal atrial fibrillation [facility])     Discharge Instructions      Patient has evidence of yeast in your urine we will give you 2 doses of Diflucan.  Take this medication every 72 hours (3 days) for 2 doses.  You had evidence of blood in your urine today.  Given that you also have abdominal pain you may actually have a kidney stone.  If your pain does not improve after your constipation cleanout or acutely worsens please go to the emergency department for a CT scan of your abdomen.  For constipation clean  out, 8 capfuls of Miralax in 32 oz of fluid 1-2 times a day until you have light brown-clear stool.   For stool maintenance, take 1-2 capfuls daily for at least 2 months.   Follow up with your cardiologist, as scheduled.       ED Prescriptions     Medication Sig Dispense Auth. Provider   fluconazole (DIFLUCAN) 150 MG tablet Take 1 tablet (150 mg total) by mouth every 3 (three) days for 2 doses. 2 tablet Lyndee Hensen, DO      PDMP not reviewed this encounter.   Lyndee Hensen, DO 06/21/22 1255

## 2022-08-26 ENCOUNTER — Other Ambulatory Visit: Payer: Self-pay | Admitting: Family Medicine

## 2022-08-26 DIAGNOSIS — R1032 Left lower quadrant pain: Secondary | ICD-10-CM

## 2022-09-04 ENCOUNTER — Ambulatory Visit
Admission: RE | Admit: 2022-09-04 | Discharge: 2022-09-04 | Disposition: A | Discharge status: Home / Self Care | Payer: Medicare Other | Source: Ambulatory Visit | Attending: Family Medicine | Admitting: Family Medicine

## 2022-09-04 DIAGNOSIS — R1032 Left lower quadrant pain: Secondary | ICD-10-CM | POA: Diagnosis not present

## 2022-09-04 HISTORY — DX: Heart failure, unspecified: I50.9

## 2022-09-04 HISTORY — DX: Type 2 diabetes mellitus without complications: E11.9

## 2022-09-04 MED ORDER — IOHEXOL 300 MG/ML  SOLN
100.0000 mL | Freq: Once | INTRAMUSCULAR | Status: AC | PRN
  Administered 2022-09-04: 100 mL via INTRAVENOUS

## 2023-05-14 ENCOUNTER — Ambulatory Visit: Payer: Medicare Other

## 2023-05-14 DIAGNOSIS — Z23 Encounter for immunization: Secondary | ICD-10-CM

## 2023-05-14 DIAGNOSIS — Z719 Counseling, unspecified: Secondary | ICD-10-CM

## 2023-05-14 NOTE — Progress Notes (Signed)
In nurse clinic for Covid vaccine -Moderna today. VIS given. Tolerated vaccine well to right delt. [facility] updated and copy given. observation no adverse effects noted.

## 2023-05-29 ENCOUNTER — Ambulatory Visit: Payer: Medicare Other

## 2023-08-27 ENCOUNTER — Other Ambulatory Visit: Payer: Self-pay | Admitting: Family Medicine

## 2023-08-27 DIAGNOSIS — Z0000 Encounter for general adult medical examination without abnormal findings: Secondary | ICD-10-CM

## 2023-08-27 DIAGNOSIS — N644 Mastodynia: Secondary | ICD-10-CM

## 2023-10-02 ENCOUNTER — Ambulatory Visit
Admission: RE | Admit: 2023-10-02 | Discharge: 2023-10-02 | Disposition: A | Discharge status: Home / Self Care | Source: Ambulatory Visit | Attending: Family Medicine | Admitting: Family Medicine

## 2023-10-02 ENCOUNTER — Other Ambulatory Visit: Payer: Self-pay | Admitting: Family Medicine

## 2023-10-02 DIAGNOSIS — Z0000 Encounter for general adult medical examination without abnormal findings: Secondary | ICD-10-CM

## 2023-10-02 DIAGNOSIS — R928 Other abnormal and inconclusive findings on diagnostic imaging of breast: Secondary | ICD-10-CM | POA: Diagnosis present

## 2023-10-02 DIAGNOSIS — N644 Mastodynia: Secondary | ICD-10-CM | POA: Diagnosis present

## 2024-03-22 ENCOUNTER — Emergency Department

## 2024-03-22 ENCOUNTER — Emergency Department
Admission: EM | Admit: 2024-03-22 | Discharge: 2024-03-22 | Disposition: A | Discharge status: Home / Self Care | Attending: Emergency Medicine | Admitting: Emergency Medicine

## 2024-03-22 ENCOUNTER — Other Ambulatory Visit: Payer: Self-pay

## 2024-03-22 DIAGNOSIS — Z950 Presence of cardiac pacemaker: Secondary | ICD-10-CM | POA: Diagnosis not present

## 2024-03-22 DIAGNOSIS — N3000 Acute cystitis without hematuria: Secondary | ICD-10-CM | POA: Diagnosis not present

## 2024-03-22 DIAGNOSIS — R1024 Suprapubic pain: Secondary | ICD-10-CM

## 2024-03-22 DIAGNOSIS — R1030 Lower abdominal pain, unspecified: Secondary | ICD-10-CM | POA: Diagnosis present

## 2024-03-22 LAB — CBC
HCT: 36.9 % (ref 36.0–46.0)
Hemoglobin: 11.9 g/dL — ABNORMAL LOW (ref 12.0–15.0)
MCH: 28.1 pg (ref 26.0–34.0)
MCHC: 32.2 g/dL (ref 30.0–36.0)
MCV: 87 fL (ref 80.0–100.0)
Platelets: 235 K/uL (ref 150–400)
RBC: 4.24 MIL/uL (ref 3.87–5.11)
RDW: 14.4 % (ref 11.5–15.5)
WBC: 5.9 K/uL (ref 4.0–10.5)
nRBC: 0 % (ref 0.0–0.2)

## 2024-03-22 LAB — URINALYSIS, ROUTINE W REFLEX MICROSCOPIC
Bilirubin Urine: NEGATIVE
Glucose, UA: NEGATIVE mg/dL
Ketones, ur: NEGATIVE mg/dL
Leukocytes,Ua: NEGATIVE
Nitrite: POSITIVE — AB
Protein, ur: NEGATIVE mg/dL
Specific Gravity, Urine: 1.008 (ref 1.005–1.030)
pH: 6 (ref 5.0–8.0)

## 2024-03-22 LAB — COMPREHENSIVE METABOLIC PANEL WITH GFR
ALT: 17 U/L (ref 0–44)
AST: 19 U/L (ref 15–41)
Albumin: 3.7 g/dL (ref 3.5–5.0)
Alkaline Phosphatase: 69 U/L (ref 38–126)
Anion gap: 7 (ref 5–15)
BUN: 13 mg/dL (ref 8–23)
CO2: 29 mmol/L (ref 22–32)
Calcium: 8.7 mg/dL — ABNORMAL LOW (ref 8.9–10.3)
Chloride: 105 mmol/L (ref 98–111)
Creatinine, Ser: 0.83 mg/dL (ref 0.44–1.00)
GFR, Estimated: >60 mL/min (ref >60)
Glucose, Bld: 93 mg/dL (ref 70–99)
Potassium: 3.8 mmol/L (ref 3.5–5.1)
Sodium: 141 mmol/L (ref 135–145)
Total Bilirubin: 0.6 mg/dL (ref 0.0–1.2)
Total Protein: 6.6 g/dL (ref 6.5–8.1)

## 2024-03-22 LAB — LIPASE, BLOOD: Lipase: 37 U/L (ref 11–51)

## 2024-03-22 MED ORDER — ACETAMINOPHEN 500 MG PO TABS: 1000.0000 mg | ORAL_TABLET | Freq: Four times a day (QID) | ORAL | 0 refills | Status: AC | PRN

## 2024-03-22 MED ORDER — ACETAMINOPHEN 500 MG PO TABS
1000.0000 mg | ORAL_TABLET | Freq: Once | ORAL | Status: AC
  Administered 2024-03-22: 1000 mg via ORAL
  Filled 2024-03-22: qty 2

## 2024-03-22 MED ORDER — CEPHALEXIN 500 MG PO CAPS: 500.0000 mg | ORAL_CAPSULE | Freq: Two times a day (BID) | ORAL | 0 refills | Status: DC

## 2024-03-22 MED ORDER — IOHEXOL 300 MG/ML  SOLN
100.0000 mL | Freq: Once | INTRAMUSCULAR | Status: AC | PRN
  Administered 2024-03-22: 100 mL via INTRAVENOUS

## 2024-03-22 MED ORDER — CEPHALEXIN 500 MG PO CAPS: 500.0000 mg | ORAL_CAPSULE | Freq: Two times a day (BID) | ORAL | 0 refills | Status: AC

## 2024-03-22 NOTE — ED Provider Notes (Signed)
 [facility] Provider Note    Event Date/Time   First MD Initiated Contact with Patient 03/22/24 1240     (approximate)   History   Abdominal Pain   HPI  Whitney Ruiz is a 63 y.o. female presents to the emergency department with lower abdominal pain.  States that she started having lower abdominal pain earlier today.  Denies any urinary or bowel incontinence.  No back pain.  No radiation down her legs.  No nausea or vomiting.  No fever or chills.  No history of kidney stone.  Denies dysuria, urinary urgency or frequency.  Prior C-section.  Pacemaker is present.  Denies any chest pain or shortness of breath.  No falls or trauma.     Physical Exam   Triage Vital Signs: ED Triage Vitals  Encounter Vitals Group     BP 03/22/24 1154 (!) 159/90     Girls Systolic BP Percentile --      Girls Diastolic BP Percentile --      Boys Systolic BP Percentile --      Boys Diastolic BP Percentile --      Pulse Rate 03/22/24 1154 60     Resp 03/22/24 1154 18     Temp 03/22/24 1154 98 F (36.7 C)     Temp src --      SpO2 03/22/24 1154 100 %     Weight 03/22/24 1153 219 lb (99.3 kg)     Height 03/22/24 1153 5' 9 (1.753 m)     Head Circumference --      Peak Flow --      Pain Score 03/22/24 1153 10     Pain Loc --      Pain Education --      Exclude from Growth Chart --     Most recent vital signs: Vitals:   03/22/24 1245 03/22/24 1600  BP:  133/67  Pulse:  61  Resp:  18  Temp:    SpO2: 100% 98%    Physical Exam Constitutional:      Appearance: She is well-developed.  HENT:     Head: Atraumatic.  Eyes:     Conjunctiva/sclera: Conjunctivae normal.  Cardiovascular:     Rate and Rhythm: Regular rhythm.  Pulmonary:     Effort: No respiratory distress.  Abdominal:     General: There is no distension.     Tenderness: There is abdominal tenderness in the suprapubic area. There is no right CVA tenderness, left CVA tenderness, guarding or rebound.  Negative signs include Murphy's sign and McBurney's sign.  Musculoskeletal:        General: Normal range of motion.     Cervical back: Normal range of motion.  Skin:    General: Skin is warm.     Capillary Refill: Capillary refill takes less than 2 seconds.  Neurological:     General: No focal deficit present.     Mental Status: She is alert. Mental status is at baseline.     IMPRESSION / MDM / ASSESSMENT AND PLAN / ED COURSE  I reviewed the triage vital signs and the nursing notes.  Differential diagnosis including pyelonephritis, kidney stone, urinary tract infection, AAA, appendicitis, small bowel obstruction  No tachycardic or bradycardic dysrhythmias while on cardiac telemetry.  RADIOLOGY CT scan abdomen and pelvis with contrast with no acute findings.  Gallbladder and liver were unremarkable with no biliary dilation.  Normal pancreas. LABS (all labs ordered are listed, but only abnormal  results are displayed) Labs interpreted as -    Labs Reviewed  COMPREHENSIVE METABOLIC PANEL WITH GFR - Abnormal; Notable for the following components:      Result Value   Calcium 8.7 (*)    All other components within normal limits  CBC - Abnormal; Notable for the following components:   Hemoglobin 11.9 (*)    All other components within normal limits  URINALYSIS, ROUTINE W REFLEX MICROSCOPIC - Abnormal; Notable for the following components:   Color, Urine YELLOW (*)    APPearance CLEAR (*)    Hgb urine dipstick SMALL (*)    Nitrite POSITIVE (*)    Bacteria, UA RARE (*)    All other components within normal limits  URINE CULTURE  LIPASE, BLOOD     MDM  Patient's lab work overall unremarkable.  No significant leukocytosis.  Creatinine is at baseline.  No significant electrolyte abnormality.  Urine has positive nitrite but rare bacteria.  Urine was sent for culture.  Given her suprapubic pain will start on antibiotic.  Discussed return precautions for any ongoing or worsening  symptoms.  Discussed close follow-up with primary care physician.  Repeat abdominal exam continues to be benign with no signs of peritonitis.  No chest pain or shortness of breath have a low suspicion for ACS.  Urine was sent for culture.  Will follow-up with primary care physician was started on Keflex.     PROCEDURES:  Critical Care performed: No  Procedures  Patient's presentation is most consistent with acute presentation with potential threat to life or bodily function.   MEDICATIONS ORDERED IN ED: Medications  acetaminophen (TYLENOL) tablet 1,000 mg (1,000 mg Oral Given 03/22/24 1331)  iohexol  (OMNIPAQUE ) 300 MG/ML solution 100 mL (100 mLs Intravenous Contrast Given 03/22/24 1425)    FINAL CLINICAL IMPRESSION(S) / ED DIAGNOSES   Final diagnoses:  Suprapubic pain  Acute cystitis without hematuria     Rx / DC Orders   ED Discharge Orders          Ordered    cephALEXin (KEFLEX) 500 MG capsule  2 times daily,   Status:  Discontinued        03/22/24 1604    cephALEXin (KEFLEX) 500 MG capsule  2 times daily        03/22/24 1607             Note:  This document was prepared using Dragon voice recognition software and may include unintentional dictation errors.   Suzanne Kirsch, MD 03/22/24 [phone]

## 2024-03-22 NOTE — ED Notes (Signed)
 Pt ambulating back and forth to the restroom without incident.

## 2024-03-22 NOTE — Discharge Instructions (Addendum)
 You are seen in the emergency department for lower abdominal pain.  Your urine appeared infected and findings concerning for urinary tract infection.  The rest of your lab work was overall unremarkable.  The CT scan of your abdomen did not show any findings to explain your pain today.  You were started on antibiotic.  Your urine was sent for culture.  Call your primary care physician to schedule close follow-up appointment.  Return to the emergency department for any worsening symptoms.  Pain control:  Ibuprofen  (motrin /aleve /advil ) - You can take 3 tablets (600 mg) every 6 hours as needed for pain/fever.  Acetaminophen (tylenol) - You can take 2 extra strength tablets (1000 mg) every 6 hours as needed for pain/fever.  You can alternate these medications or take them together.  Make sure you eat food/drink water when taking these medications.

## 2024-03-22 NOTE — ED Triage Notes (Signed)
 Pt comes with sudden pain in lower abdomen that radiates to her back. Pt states no urinary symptoms. Pt states she was fine this morning and this came out of no where. Pt took pain med aleve  with no relief.

## 2024-03-22 NOTE — ED Notes (Signed)
 At this time, this EDT brought back this pt to [facility], pt requested to use the bathroom, pt was brought to the bathroom in a wheelchair and needed light assistance to get on the toilet. Pt was brought back to the room, resting in bed, call light within reach.

## 2024-03-26 LAB — URINE CULTURE: Culture: >=100000 — AB
